# Patient Record
Sex: Female | Born: 1955 | Race: White | Hispanic: No | Marital: Married | Smoking: Never smoker
Health system: Southern US, Community
[De-identification: ages and names within clinical notes are randomized; demographics above are authoritative.]

## PROBLEM LIST (undated history)

## (undated) DIAGNOSIS — Z9889 Other specified postprocedural states: Secondary | ICD-10-CM

## (undated) DIAGNOSIS — R112 Nausea with vomiting, unspecified: Secondary | ICD-10-CM

## (undated) DIAGNOSIS — H409 Unspecified glaucoma: Secondary | ICD-10-CM

## (undated) DIAGNOSIS — T8859XA Other complications of anesthesia, initial encounter: Secondary | ICD-10-CM

## (undated) DIAGNOSIS — I341 Nonrheumatic mitral (valve) prolapse: Secondary | ICD-10-CM

## (undated) DIAGNOSIS — E7800 Pure hypercholesterolemia, unspecified: Secondary | ICD-10-CM

## (undated) DIAGNOSIS — T4145XA Adverse effect of unspecified anesthetic, initial encounter: Secondary | ICD-10-CM

## (undated) HISTORY — PX: COLPOSCOPY: SHX161

## (undated) HISTORY — PX: TONSILLECTOMY: SUR1361

## (undated) HISTORY — PX: EYE SURGERY: SHX253

## (undated) HISTORY — PX: COLONOSCOPY: SHX174

---

## 2004-01-05 ENCOUNTER — Ambulatory Visit: Payer: Self-pay | Admitting: Unknown Physician Specialty

## 2004-03-09 ENCOUNTER — Ambulatory Visit: Payer: Self-pay | Admitting: Unknown Physician Specialty

## 2005-01-03 ENCOUNTER — Ambulatory Visit: Payer: Self-pay | Admitting: Unknown Physician Specialty

## 2005-03-30 ENCOUNTER — Ambulatory Visit: Payer: Self-pay | Admitting: Unknown Physician Specialty

## 2005-04-05 ENCOUNTER — Ambulatory Visit: Payer: Self-pay | Admitting: Unknown Physician Specialty

## 2006-01-23 ENCOUNTER — Ambulatory Visit: Payer: Self-pay | Admitting: Unknown Physician Specialty

## 2006-08-21 ENCOUNTER — Ambulatory Visit: Payer: Self-pay | Admitting: Unknown Physician Specialty

## 2007-09-09 ENCOUNTER — Ambulatory Visit: Payer: Self-pay | Admitting: Unknown Physician Specialty

## 2008-10-06 ENCOUNTER — Ambulatory Visit: Payer: Self-pay | Admitting: Unknown Physician Specialty

## 2008-10-12 ENCOUNTER — Ambulatory Visit: Payer: Self-pay | Admitting: Unknown Physician Specialty

## 2009-10-11 ENCOUNTER — Ambulatory Visit: Payer: Self-pay | Admitting: Unknown Physician Specialty

## 2016-03-08 ENCOUNTER — Other Ambulatory Visit: Payer: Self-pay | Admitting: Obstetrics & Gynecology

## 2016-03-08 DIAGNOSIS — Z1231 Encounter for screening mammogram for malignant neoplasm of breast: Secondary | ICD-10-CM

## 2016-03-14 ENCOUNTER — Inpatient Hospital Stay: Admission: RE | Admit: 2016-03-14 | Payer: Self-pay | Source: Ambulatory Visit

## 2016-03-26 ENCOUNTER — Inpatient Hospital Stay: Admission: RE | Admit: 2016-03-26 | Payer: Self-pay | Source: Ambulatory Visit

## 2016-03-29 ENCOUNTER — Encounter
Admission: RE | Admit: 2016-03-29 | Discharge: 2016-03-29 | Disposition: A | Discharge status: Home / Self Care | Payer: 59 | Source: Ambulatory Visit | Attending: Obstetrics & Gynecology | Admitting: Obstetrics & Gynecology

## 2016-03-29 DIAGNOSIS — Z01810 Encounter for preprocedural cardiovascular examination: Secondary | ICD-10-CM | POA: Insufficient documentation

## 2016-03-29 DIAGNOSIS — Z01812 Encounter for preprocedural laboratory examination: Secondary | ICD-10-CM | POA: Insufficient documentation

## 2016-03-29 DIAGNOSIS — I341 Nonrheumatic mitral (valve) prolapse: Secondary | ICD-10-CM | POA: Diagnosis present

## 2016-03-29 HISTORY — DX: Nausea with vomiting, unspecified: R11.2

## 2016-03-29 HISTORY — DX: Adverse effect of unspecified anesthetic, initial encounter: T41.45XA

## 2016-03-29 HISTORY — DX: Other complications of anesthesia, initial encounter: T88.59XA

## 2016-03-29 HISTORY — DX: Nonrheumatic mitral (valve) prolapse: I34.1

## 2016-03-29 HISTORY — DX: Unspecified glaucoma: H40.9

## 2016-03-29 HISTORY — DX: Other specified postprocedural states: Z98.890

## 2016-03-29 LAB — BASIC METABOLIC PANEL
Anion gap: 6 (ref 5–15)
BUN: 16 mg/dL (ref 6–20)
CALCIUM: 9.8 mg/dL (ref 8.9–10.3)
CO2: 28 mmol/L (ref 22–32)
CREATININE: 0.67 mg/dL (ref 0.44–1.00)
Chloride: 106 mmol/L (ref 101–111)
GFR calc Af Amer: >60 mL/min (ref >60)
GLUCOSE: 95 mg/dL (ref 65–99)
Potassium: 4 mmol/L (ref 3.5–5.1)
SODIUM: 140 mmol/L (ref 135–145)

## 2016-03-29 LAB — TYPE AND SCREEN
ABO/RH(D): AB POS
Antibody Screen: NEGATIVE

## 2016-03-29 LAB — CBC
HCT: 41.4 % (ref 35.0–47.0)
Hemoglobin: 14.5 g/dL (ref 12.0–16.0)
MCH: 34.2 pg — AB (ref 26.0–34.0)
MCHC: 35.1 g/dL (ref 32.0–36.0)
MCV: 97.6 fL (ref 80.0–100.0)
PLATELETS: 218 10*3/uL (ref 150–440)
RBC: 4.25 MIL/uL (ref 3.80–5.20)
RDW: 11.9 % (ref 11.5–14.5)
WBC: 6.2 10*3/uL (ref 3.6–11.0)

## 2016-03-29 NOTE — Patient Instructions (Signed)
Your procedure is scheduled on: Monday 04/02/16 Report to DAY SURGERY. 2ND FLOOR MEDICAL MALL ENTRANCE. To find out your arrival time please call [phone] between 1PM - 3PM on Friday 03/30/16.  Remember: Instructions that are not followed completely may result in serious medical risk, up to and including death, or upon the discretion of your surgeon and anesthesiologist your surgery may need to be rescheduled.    __X__ 1. Do not eat food or drink liquids after midnight. No gum chewing or hard candies.     __X__ 2. No Alcohol for 24 hours before or after surgery.   ____ 3. Bring all medications with you on the day of surgery if instructed.    __X__ 4. Notify your doctor if there is any change in your medical condition     (cold, fever, infections).             ___X__5. No smoking within 24 hours of your surgery.     Do not wear jewelry, make-up, hairpins, clips or nail polish.  Do not wear lotions, powders, or perfumes.   Do not shave 48 hours prior to surgery. Men may shave face and neck.  Do not bring valuables to the hospital.    [facility] any belongings or valuables.               Contacts, dentures or bridgework may not be worn into surgery.  Leave your suitcase in the car. After surgery it may be brought to your room.  For patients admitted to the hospital, discharge time is determined by your                treatment team.   Patients discharged the day of surgery will not be allowed to drive home.   Please read over the following fact sheets that you were given:   Pain Booklet and MRSA Information   ____ Take these medicines the morning of surgery with A SIP OF WATER:    1. NONE  2.   3.   4.  5.  6.  ____ Fleet Enema (as directed)   __X__ Use CHG Soap as directed  ____ Use inhalers on the day of surgery  ____ Stop metformin 2 days prior to surgery    ____ Take 1/2 of usual insulin dose the night before surgery and none on the morning  of surgery.   ____ Stop Coumadin/Plavix/aspirin on   __X__ Stop Anti-inflammatories such as Advil, Aleve, Ibuprofen, Motrin, Naproxen, Naprosyn, Goodies,powder, or aspirin products.  OK to take Tylenol.   __X__ Stop supplements until after surgery.    ____ Bring C-Pap to the hospital.

## 2016-04-02 ENCOUNTER — Inpatient Hospital Stay: Payer: 59 | Admitting: Anesthesiology

## 2016-04-02 ENCOUNTER — Ambulatory Visit
Admission: RE | Admit: 2016-04-02 | Discharge: 2016-04-02 | Disposition: A | Discharge status: Home / Self Care | Payer: 59 | Source: Ambulatory Visit | Attending: Obstetrics & Gynecology | Admitting: Obstetrics & Gynecology

## 2016-04-02 ENCOUNTER — Encounter: Payer: Self-pay | Admitting: *Deleted

## 2016-04-02 ENCOUNTER — Encounter
Admission: RE | Disposition: A | Discharge status: Home / Self Care | Payer: Self-pay | Source: Ambulatory Visit | Attending: Obstetrics & Gynecology

## 2016-04-02 DIAGNOSIS — H409 Unspecified glaucoma: Secondary | ICD-10-CM | POA: Diagnosis not present

## 2016-04-02 DIAGNOSIS — Z1502 Genetic susceptibility to malignant neoplasm of ovary: Secondary | ICD-10-CM | POA: Insufficient documentation

## 2016-04-02 DIAGNOSIS — Z79899 Other long term (current) drug therapy: Secondary | ICD-10-CM | POA: Insufficient documentation

## 2016-04-02 DIAGNOSIS — Z1509 Genetic susceptibility to other malignant neoplasm: Secondary | ICD-10-CM | POA: Diagnosis not present

## 2016-04-02 DIAGNOSIS — Z8041 Family history of malignant neoplasm of ovary: Secondary | ICD-10-CM | POA: Insufficient documentation

## 2016-04-02 HISTORY — PX: LAPAROSCOPIC BILATERAL SALPINGO OOPHERECTOMY: SHX5890

## 2016-04-02 LAB — ABO/RH: ABO/RH(D): AB POS

## 2016-04-02 SURGERY — SALPINGO-OOPHORECTOMY, BILATERAL, LAPAROSCOPIC
Anesthesia: General | Laterality: Bilateral | Wound class: Clean Contaminated

## 2016-04-02 MED ORDER — KETOROLAC TROMETHAMINE 30 MG/ML IJ SOLN: 30.0000 mg | Freq: Four times a day (QID) | INTRAMUSCULAR | Status: DC

## 2016-04-02 MED ORDER — PROPOFOL 500 MG/50ML IV EMUL
INTRAVENOUS | Status: DC | PRN
  Administered 2016-04-02: 25 ug/kg/min via INTRAVENOUS

## 2016-04-02 MED ORDER — LIDOCAINE HCL (PF) 2 % IJ SOLN
INTRAMUSCULAR | Status: AC
  Filled 2016-04-02: qty 2

## 2016-04-02 MED ORDER — FENTANYL CITRATE (PF) 100 MCG/2ML IJ SOLN
INTRAMUSCULAR | Status: DC | PRN
  Administered 2016-04-02: 50 ug via INTRAVENOUS
  Administered 2016-04-02 (×2): 25 ug via INTRAVENOUS

## 2016-04-02 MED ORDER — SODIUM CHLORIDE 0.9 % IJ SOLN
INTRAMUSCULAR | Status: AC
  Filled 2016-04-02: qty 10

## 2016-04-02 MED ORDER — PROPOFOL 10 MG/ML IV BOLUS
INTRAVENOUS | Status: AC
  Filled 2016-04-02: qty 20

## 2016-04-02 MED ORDER — PROMETHAZINE HCL 25 MG/ML IJ SOLN
INTRAMUSCULAR | Status: AC
  Administered 2016-04-02: 6.25 mg via INTRAVENOUS
  Filled 2016-04-02: qty 1

## 2016-04-02 MED ORDER — MIDAZOLAM HCL 2 MG/2ML IJ SOLN
INTRAMUSCULAR | Status: AC
  Filled 2016-04-02: qty 2

## 2016-04-02 MED ORDER — ROCURONIUM BROMIDE 100 MG/10ML IV SOLN
INTRAVENOUS | Status: DC | PRN
  Administered 2016-04-02: 10 mg via INTRAVENOUS
  Administered 2016-04-02: 20 mg via INTRAVENOUS
  Administered 2016-04-02: 50 mg via INTRAVENOUS
  Administered 2016-04-02: 10 mg via INTRAVENOUS

## 2016-04-02 MED ORDER — BUPIVACAINE HCL 0.5 % IJ SOLN
INTRAMUSCULAR | Status: DC | PRN
  Administered 2016-04-02: 30 mL

## 2016-04-02 MED ORDER — ACETAMINOPHEN 650 MG RE SUPP
650.0000 mg | RECTAL | Status: DC | PRN
  Filled 2016-04-02: qty 1

## 2016-04-02 MED ORDER — DEXAMETHASONE SODIUM PHOSPHATE 10 MG/ML IJ SOLN
INTRAMUSCULAR | Status: DC | PRN
  Administered 2016-04-02: 10 mg via INTRAVENOUS

## 2016-04-02 MED ORDER — EPHEDRINE SULFATE 50 MG/ML IJ SOLN
INTRAMUSCULAR | Status: DC | PRN
  Administered 2016-04-02 (×2): 10 mg via INTRAVENOUS

## 2016-04-02 MED ORDER — FAMOTIDINE 20 MG PO TABS
20.0000 mg | ORAL_TABLET | Freq: Once | ORAL | Status: AC
  Administered 2016-04-02: 20 mg via ORAL

## 2016-04-02 MED ORDER — MORPHINE SULFATE (PF) 4 MG/ML IV SOLN: 1.0000 mg | INTRAVENOUS | Status: DC | PRN

## 2016-04-02 MED ORDER — ROCURONIUM BROMIDE 50 MG/5ML IV SOLN
INTRAVENOUS | Status: AC
  Filled 2016-04-02: qty 1

## 2016-04-02 MED ORDER — FAMOTIDINE 20 MG PO TABS
ORAL_TABLET | ORAL | Status: AC
  Administered 2016-04-02: 20 mg via ORAL
  Filled 2016-04-02: qty 1

## 2016-04-02 MED ORDER — SCOPOLAMINE 1 MG/3DAYS TD PT72
MEDICATED_PATCH | TRANSDERMAL | Status: AC
  Filled 2016-04-02: qty 1

## 2016-04-02 MED ORDER — SUGAMMADEX SODIUM 200 MG/2ML IV SOLN
INTRAVENOUS | Status: AC
  Filled 2016-04-02: qty 2

## 2016-04-02 MED ORDER — ONDANSETRON HCL 4 MG/2ML IJ SOLN
INTRAMUSCULAR | Status: DC | PRN
  Administered 2016-04-02: 4 mg via INTRAVENOUS

## 2016-04-02 MED ORDER — ACETAMINOPHEN 10 MG/ML IV SOLN
INTRAVENOUS | Status: DC | PRN
  Administered 2016-04-02: 1000 mg via INTRAVENOUS

## 2016-04-02 MED ORDER — KETOROLAC TROMETHAMINE 30 MG/ML IJ SOLN
INTRAMUSCULAR | Status: AC
  Filled 2016-04-02: qty 1

## 2016-04-02 MED ORDER — OXYCODONE HCL 5 MG PO TABS
ORAL_TABLET | ORAL | Status: AC
  Filled 2016-04-02: qty 1

## 2016-04-02 MED ORDER — ONDANSETRON HCL 4 MG/2ML IJ SOLN
INTRAMUSCULAR | Status: AC
  Filled 2016-04-02: qty 2

## 2016-04-02 MED ORDER — SUGAMMADEX SODIUM 200 MG/2ML IV SOLN
INTRAVENOUS | Status: DC | PRN
Start: 2016-04-02 — End: 2016-04-02
  Administered 2016-04-02: 150 mg via INTRAVENOUS

## 2016-04-02 MED ORDER — MEPERIDINE HCL 50 MG/ML IJ SOLN: 6.2500 mg | INTRAMUSCULAR | Status: DC | PRN

## 2016-04-02 MED ORDER — BUPIVACAINE LIPOSOME 1.3 % IJ SUSP
INTRAMUSCULAR | Status: AC
  Filled 2016-04-02: qty 20

## 2016-04-02 MED ORDER — ACETAMINOPHEN 10 MG/ML IV SOLN
INTRAVENOUS | Status: AC
  Filled 2016-04-02: qty 100

## 2016-04-02 MED ORDER — OXYCODONE HCL 5 MG/5ML PO SOLN: 5.0000 mg | Freq: Once | ORAL | Status: AC | PRN

## 2016-04-02 MED ORDER — ARTIFICIAL TEARS OP OINT
TOPICAL_OINTMENT | OPHTHALMIC | Status: AC
  Filled 2016-04-02: qty 3.5

## 2016-04-02 MED ORDER — KETOROLAC TROMETHAMINE 30 MG/ML IJ SOLN
INTRAMUSCULAR | Status: DC | PRN
  Administered 2016-04-02: 30 mg via INTRAVENOUS

## 2016-04-02 MED ORDER — FENTANYL CITRATE (PF) 100 MCG/2ML IJ SOLN
25.0000 ug | INTRAMUSCULAR | Status: DC | PRN
  Administered 2016-04-02: 25 ug via INTRAVENOUS

## 2016-04-02 MED ORDER — DEXAMETHASONE SODIUM PHOSPHATE 10 MG/ML IJ SOLN
INTRAMUSCULAR | Status: AC
  Filled 2016-04-02: qty 1

## 2016-04-02 MED ORDER — FENTANYL CITRATE (PF) 100 MCG/2ML IJ SOLN
INTRAMUSCULAR | Status: AC
  Filled 2016-04-02: qty 2

## 2016-04-02 MED ORDER — SODIUM CHLORIDE 0.9 % IV SOLN
INTRAVENOUS | Status: DC | PRN
  Administered 2016-04-02: 20 mL

## 2016-04-02 MED ORDER — LACTATED RINGERS IV SOLN
INTRAVENOUS | Status: DC | PRN
  Administered 2016-04-02 (×2): via INTRAVENOUS

## 2016-04-02 MED ORDER — LIDOCAINE HCL (CARDIAC) 20 MG/ML IV SOLN
INTRAVENOUS | Status: DC | PRN
  Administered 2016-04-02: 60 mg via INTRAVENOUS

## 2016-04-02 MED ORDER — ACETAMINOPHEN 325 MG PO TABS: 650.0000 mg | ORAL_TABLET | ORAL | Status: DC | PRN

## 2016-04-02 MED ORDER — ACETAMINOPHEN 500 MG PO TABS: 1000.0000 mg | ORAL_TABLET | Freq: Four times a day (QID) | ORAL | 0 refills | Status: AC

## 2016-04-02 MED ORDER — EPHEDRINE 5 MG/ML INJ
INTRAVENOUS | Status: AC
  Filled 2016-04-02: qty 10

## 2016-04-02 MED ORDER — LACTATED RINGERS IV SOLN
INTRAVENOUS | Status: DC
  Administered 2016-04-02: 08:00:00 via INTRAVENOUS

## 2016-04-02 MED ORDER — SCOPOLAMINE 1 MG/3DAYS TD PT72
1.0000 | MEDICATED_PATCH | Freq: Once | TRANSDERMAL | Status: DC
  Administered 2016-04-02: 1.5 mg via TRANSDERMAL

## 2016-04-02 MED ORDER — PROPOFOL 10 MG/ML IV BOLUS
INTRAVENOUS | Status: DC | PRN
  Administered 2016-04-02: 120 mg via INTRAVENOUS

## 2016-04-02 MED ORDER — BUPIVACAINE HCL (PF) 0.5 % IJ SOLN
INTRAMUSCULAR | Status: AC
  Filled 2016-04-02: qty 30

## 2016-04-02 MED ORDER — IBUPROFEN 600 MG PO TABS: 600.0000 mg | ORAL_TABLET | Freq: Four times a day (QID) | ORAL | 1 refills | Status: AC

## 2016-04-02 MED ORDER — OXYCODONE HCL 5 MG PO TABS
5.0000 mg | ORAL_TABLET | Freq: Once | ORAL | Status: AC | PRN
  Administered 2016-04-02: 5 mg via ORAL

## 2016-04-02 MED ORDER — OXYCODONE HCL 5 MG PO TABS: 5.0000 mg | ORAL_TABLET | ORAL | 0 refills | Status: AC | PRN

## 2016-04-02 MED ORDER — PROMETHAZINE HCL 25 MG/ML IJ SOLN
6.2500 mg | INTRAMUSCULAR | Status: DC | PRN
  Administered 2016-04-02: 6.25 mg via INTRAVENOUS

## 2016-04-02 SURGICAL SUPPLY — 57 items
BAG URO DRAIN 2000ML W/SPOUT (MISCELLANEOUS) ×2 IMPLANT
BLADE SURG SZ11 CARB STEEL (BLADE) ×2 IMPLANT
CANISTER SUCT 1200ML W/VALVE (MISCELLANEOUS) ×2 IMPLANT
CATH FOLEY 2WAY  5CC 16FR (CATHETERS) ×1
CATH ROBINSON RED A/P 16FR (CATHETERS) ×2 IMPLANT
CATH URTH 16FR FL 2W BLN LF (CATHETERS) ×1 IMPLANT
CHLORAPREP W/TINT 26ML (MISCELLANEOUS) ×2 IMPLANT
CNTNR SPEC 2.5X3XGRAD LEK (MISCELLANEOUS) ×1
CONT SPEC 4OZ STER OR WHT (MISCELLANEOUS) ×1
CONTAINER SPEC 2.5X3XGRAD LEK (MISCELLANEOUS) ×1 IMPLANT
DRAPE LEGGINS SURG 28X43 STRL (DRAPES) ×2 IMPLANT
DRAPE UNDER BUTTOCK W/FLU (DRAPES) ×2 IMPLANT
DRESSING TELFA 4X3 1S ST N-ADH (GAUZE/BANDAGES/DRESSINGS) ×6 IMPLANT
DRSG TEGADERM 2-3/8X2-3/4 SM (GAUZE/BANDAGES/DRESSINGS) ×4 IMPLANT
ENDOPOUCH RETRIEVER 10 (MISCELLANEOUS) ×4 IMPLANT
GAUZE SPONGE NON-WVN 2X2 STRL (MISCELLANEOUS) ×2 IMPLANT
GLOVE PI ORTHOPRO 6.5 (GLOVE) ×1
GLOVE PI ORTHOPRO STRL 6.5 (GLOVE) ×1 IMPLANT
GLOVE SURG SYN 6.5 ES PF (GLOVE) ×2 IMPLANT
GOWN STRL REUS W/ TWL LRG LVL3 (GOWN DISPOSABLE) ×2 IMPLANT
GOWN STRL REUS W/ TWL XL LVL3 (GOWN DISPOSABLE) ×1 IMPLANT
GOWN STRL REUS W/TWL LRG LVL3 (GOWN DISPOSABLE) ×2
GOWN STRL REUS W/TWL XL LVL3 (GOWN DISPOSABLE) ×1
GRASPER SUT TROCAR 14GX15 (MISCELLANEOUS) ×2 IMPLANT
IRRIGATION STRYKERFLOW (MISCELLANEOUS) IMPLANT
IRRIGATOR STRYKERFLOW (MISCELLANEOUS)
IV LACTATED RINGERS 1000ML (IV SOLUTION) ×2 IMPLANT
KIT PINK PAD W/HEAD ARE REST (MISCELLANEOUS) ×2
KIT PINK PAD W/HEAD ARM REST (MISCELLANEOUS) ×1 IMPLANT
KIT RM TURNOVER CYSTO AR (KITS) ×2 IMPLANT
L-HOOK LAP DISP 36CM (ELECTROSURGICAL) ×2
LABEL OR SOLS (LABEL) IMPLANT
LHOOK LAP DISP 36CM (ELECTROSURGICAL) ×1 IMPLANT
LIGASURE MARYLAND LAP STAND (ELECTROSURGICAL) ×2 IMPLANT
LIGASURE VESSEL 5MM BLUNT TIP (ELECTROSURGICAL) ×2 IMPLANT
LIQUID BAND (GAUZE/BANDAGES/DRESSINGS) IMPLANT
MANIPULATOR UTERINE 4.5 ZUMI (MISCELLANEOUS) ×2 IMPLANT
MANIPULATOR UTERINE ZUMI 4.5 (MISCELLANEOUS) ×2 IMPLANT
NEEDLE VERESS 14GA 120MM (NEEDLE) ×2 IMPLANT
NS IRRIG 500ML POUR BTL (IV SOLUTION) ×2 IMPLANT
PACK LAP CHOLECYSTECTOMY (MISCELLANEOUS) ×2 IMPLANT
PAD OB MATERNITY 4.3X12.25 (PERSONAL CARE ITEMS) ×2 IMPLANT
PAD PREP 24X41 OB/GYN DISP (PERSONAL CARE ITEMS) ×2 IMPLANT
PENCIL ELECTRO HAND CTR (MISCELLANEOUS) ×2 IMPLANT
SCISSORS METZENBAUM CVD 33 (INSTRUMENTS) ×2 IMPLANT
SET CYSTO W/LG BORE CLAMP LF (SET/KITS/TRAYS/PACK) ×2 IMPLANT
SHEARS HARMONIC ACE PLUS 36CM (ENDOMECHANICALS) ×2 IMPLANT
SLEEVE ENDOPATH XCEL 5M (ENDOMECHANICALS) ×4 IMPLANT
SPONGE VERSALON 2X2 STRL (MISCELLANEOUS) ×2
SUT MNCRL AB 4-0 PS2 18 (SUTURE) ×2 IMPLANT
SUT VIC AB 2-0 UR6 27 (SUTURE) ×2 IMPLANT
SUT VIC AB 4-0 PS2 18 (SUTURE) ×2 IMPLANT
SYR 30ML LL (SYRINGE) ×2 IMPLANT
SYRINGE 10CC LL (SYRINGE) ×2 IMPLANT
TROCAR ENDO BLADELESS 11MM (ENDOMECHANICALS) ×2 IMPLANT
TROCAR XCEL NON-BLD 5MMX100MML (ENDOMECHANICALS) ×2 IMPLANT
TUBING INSUFFLATOR HI FLOW (MISCELLANEOUS) ×2 IMPLANT

## 2016-04-02 NOTE — Op Note (Signed)
04/02/2016  PATIENT:  Heather Madden  61 y.o. 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 Normal upper abodmen, with small adhesion of omentum to right upper abdominal wall.  2. Adherent and redundant sigmoid colon in pelvis.  Left adhesions > Right.  Fimbriated end of Left tube, posterior left ovary adherent to pelvic side wall and sigmoid colon.  3. Ureters unable to be visualized trans-or retro-peritoneally. 4.  Bilateral ureteral jets visualized via cystoscope.  5. Normal appearing tubes and ovaries bilaterally.  Normal small uterus.  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    Procedure: The patient was brought to the OR and identified as Heather Madden.  She was given general anesthesia via endotracheal route, positioned in the dorsal lithotomy position and prepped and draped in the usual sterile fashion.  A surgical time-out was called. A foley catheter was placed.  A speculum was placed in the vagina and the cervix was visualized, grasped with a single tooth tenaculum and dilated to accommodate an endometrial pipelle.  Two passes were performed, and specimen handed off.  The Zumi uterine  manipulator was placed in the uterus.  After a change of gloves, the attention was turned to the abdomen.  A periumbilical incision was made, the subcutaneous tissues were dissected, the fascia was divided, the peritoneum entered, and the GelPoint single site apparatus was inserted.  Pneumoperitoneum was created to 33mHg.   Brief survey of the abdomen was performed and appeared normal.  There was a small filmy adhesion of the omentum to the upper right abdominal wall.  This was left alone.    The right fallopian tube and ovary was grasped and the Ligasure was used to seal and divide the IP ligament, and the tube and ovary were separated from the underlying tissue to the cornua.  The specimen was placed in the anterior culdesac.  The adhesions of the left were unable to be safely manipulated through the GelPoint so a 519mtrochar was placed in the RLQ.  The left tube and ovary were tented upwards, but due to bowel adhesions the ureter was unable to be visualized.  The underlying tissues and IP ligaments were skeletonized and dissected/transected and hemostatic.   The tubes and ovaries were placed in an endocatch bag and retracted from the abdomen.  The pneumoperitoneum was deflated then recreated and all operative sites were hemostatic.    The pneumoperitoneum was deflated and Gelpoint and 19m41mrochar were removed.  The fascia was closed with a running stitch of 0-Vicryl and tested for integrity.  The subcutaneous tissues and skin were reapproximated with 4-0 monocryl.  The skin was then closed with sugical glue.    The attention was turned to the bladder.  A 70-degree cystoscope was inserted into  the urethra after the foley was removed.  The bladder was inflated with 300cc of normal saline, and inspection of the bladder wall was performed.  Bilateral ureteral jets were observed.  The cystoscope was removed.  The patient tolerated the procedure, the sponge, needle, and instrument counts were correct x2, and  the patient was brought to PACU extubated, and in a stable condition.  I was present for, and performed this procedure in its entirety.  ----- Larey Days, MD Attending Obstetrician and Gynecologist [facility], Department of [facility]

## 2016-04-02 NOTE — Discharge Instructions (Signed)
Discharge instructions:  Call office if you have any of the following: fever >101 F, chills, excessive vaginal bleeding, incision drainage or problems, leg pain or redness, or any other concerns.   Activity: Do not lift > 10 lbs for 6 weeks.  No driving until you are sure you can slam on the brakes.   You may feel some pain in your upper right abdomen/rib and right shoulder.  This is from the gas in the abdomen for surgery. This will subside over time, please be patient!  Take 600mg  Ibuprofen and 1000mg  Tylenol around the clock, every 6 hours for at least the first 3-5 days.  After this you can take as needed.  This will help decrease inflammation and promote healing.  The narcotics you'll take just as needed, as they just trick your brain into thinking its not in pain.    Please don't limit yourself in terms of routine activity.  You will be able to do most things, although they may take longer to do or be a little painful.  You can do it!   AMBULATORY SURGERY  DISCHARGE INSTRUCTIONS   1) The drugs that you were given will stay in your system until tomorrow so for the next 24 hours you should not:  A) Drive an automobile B) Make any legal decisions C) Drink any alcoholic beverage   2) You may resume regular meals tomorrow.  Today it is better to start with liquids and gradually work up to solid foods.  You may eat anything you prefer, but it is better to start with liquids, then soup and crackers, and gradually work up to solid foods.   3) Please notify your doctor immediately if you have any unusual bleeding, trouble breathing, redness and pain at the surgery site, drainage, fever, or pain not relieved by medication.    4) Additional Instructions:        Please contact your physician with any problems or Same Day Surgery at [phone], Monday through Friday 6 am to 4 pm, or [place] at [facility] Main number at [phone].  Don't be a hero, but don't be a wimp  either!

## 2016-04-02 NOTE — Anesthesia Procedure Notes (Signed)
Procedure Name: Intubation Date/Time: 04/02/2016 9:42 AM Performed by: Darlyne Russian Pre-anesthesia Checklist: Patient identified, Emergency Drugs available, Suction available, Patient being monitored and Timeout performed Patient Re-evaluated:Patient Re-evaluated prior to inductionOxygen Delivery Method: Circle system utilized Preoxygenation: Pre-oxygenation with 100% oxygen Intubation Type: IV induction Ventilation: Mask ventilation without difficulty Laryngoscope Size: Mac and 3 Grade View: Grade I Tube type: Oral Number of attempts: 1 Airway Equipment and Method: Stylet Placement Confirmation: ETT inserted through vocal cords under direct vision,  positive ETCO2 and breath sounds checked- equal and bilateral Secured at: 21 cm Tube secured with: Tape Dental Injury: Teeth and Oropharynx as per pre-operative assessment  Comments: Pt has existing scabs on upper lip.

## 2016-04-02 NOTE — Anesthesia Postprocedure Evaluation (Signed)
Anesthesia Post Note  Patient: Tanesha A Wickizer  Procedure(s) Performed: Procedure(s) (LRB): LAPAROSCOPIC BILATERAL SALPINGO OOPHORECTOMY (Bilateral)  Patient location during evaluation: PACU Anesthesia Type: General Level of consciousness: awake and alert and oriented Pain management: pain level controlled Vital Signs Assessment: post-procedure vital signs reviewed and stable Respiratory status: spontaneous breathing, nonlabored ventilation and respiratory function stable Cardiovascular status: blood pressure returned to baseline and stable Postop Assessment: no signs of nausea or vomiting Anesthetic complications: no     Last Vitals:  Vitals:   04/02/16 1159 04/02/16 1214  BP: 124/73 112/69  Pulse:  79  Resp: (!) 23 18  Temp: 36.2 C     Last Pain:  Vitals:   04/02/16 1229  TempSrc:   PainSc: 3                  Trenton Passow

## 2016-04-02 NOTE — H&P (Signed)
Preoperative History and Physical  Heather Madden is a 61 y.o.  here due to a genetic mutation found that carries a high risk of ovarian and fallopian tube cancer.  RAD51c -- recommendations from NCCN are to have a risk-reducing salpingo-oophorectomy by age 51.   No significant preoperative concerns.  Proposed surgery: Laparoscopic bilateral salpingo-oophorectomy  Past Medical History:  Diagnosis Date  . Complication of anesthesia    SEE ALLERGIES  . Glaucoma   . MVP (mitral valve prolapse)   . PONV (postoperative nausea and vomiting)    Past Surgical History:  Procedure Laterality Date  . COLONOSCOPY    . COLPOSCOPY    . EYE SURGERY     DETACHED RETINA  . TONSILLECTOMY     OB History  No data available  Patient denies any other pertinent gynecologic issues.   No current facility-administered medications on file prior to encounter.    No current outpatient prescriptions on file prior to encounter.   Allergies  Allergen Reactions  . Combigan [Brimonidine Tartrate-Timolol] Swelling  . Fentanyl Nausea And Vomiting    VOMITING  . Midazolam Nausea And Vomiting    VOMITING  . Timolol Swelling    Social History:   reports that she has never smoked. She has never used smokeless tobacco. She reports that she drinks about 1.2 oz of alcohol per week . She reports that she does not use drugs.  History reviewed. No pertinent family history.  Review of Systems: Noncontributory  PHYSICAL EXAM: Blood pressure 128/78, pulse 84, temperature 97.5 F (36.4 C), temperature source Tympanic, resp. rate 18, height 5' 4.5" (1.638 m), weight 74.4 kg (164 lb), SpO2 96 %. General appearance - alert, well appearing, and in no distress Chest - clear to auscultation, no wheezes, rales or rhonchi, symmetric air entry Heart - normal rate and regular rhythm Abdomen - soft, nontender, nondistended, no masses or organomegaly Pelvic - examination not indicated Extremities - peripheral pulses  normal, no pedal edema, no clubbing or cyanosis  Labs: Results for orders placed or performed during the hospital encounter of 04/02/16 (from the past 336 hour(s))  ABO/Rh   Collection Time: 04/02/16  8:11 AM  Result Value Ref Range   ABO/RH(D) AB POS   Results for orders placed or performed during the hospital encounter of 03/29/16 (from the past 336 hour(s))  CBC   Collection Time: 03/29/16 12:14 PM  Result Value Ref Range   WBC 6.2 3.6 - 11.0 K/uL   RBC 4.25 3.80 - 5.20 MIL/uL   Hemoglobin 14.5 12.0 - 16.0 g/dL   HCT 41.4 35.0 - 47.0 %   MCV 97.6 80.0 - 100.0 fL   MCH 34.2 (H) 26.0 - 34.0 pg   MCHC 35.1 32.0 - 36.0 g/dL   RDW 11.9 11.5 - 14.5 %   Platelets 218 150 - 440 K/uL  Basic metabolic panel   Collection Time: 03/29/16 12:14 PM  Result Value Ref Range   Sodium 140 135 - 145 mmol/L   Potassium 4.0 3.5 - 5.1 mmol/L   Chloride 106 101 - 111 mmol/L   CO2 28 22 - 32 mmol/L   Glucose, Bld 95 65 - 99 mg/dL   BUN 16 6 - 20 mg/dL   Creatinine, Ser 0.67 0.44 - 1.00 mg/dL   Calcium 9.8 8.9 - 10.3 mg/dL   GFR calc non Af Amer >60 >60 mL/min   GFR calc Af Amer >60 >60 mL/min   Anion gap 6 5 - 15  Type  and screen [place]   Collection Time: 03/29/16 12:14 PM  Result Value Ref Range   ABO/RH(D) AB POS    Antibody Screen NEG    Sample Expiration 04/12/2016    Extend sample reason NO TRANSFUSIONS OR PREGNANCY IN THE PAST 3 MONTHS     Imaging Studies: No results found.  Assessment: There are no active problems to display for this patient.   Plan: Patient will undergo surgical management with laparoscopic bilateral salpingo-oophorectomy.   The risks of surgery were discussed in detail with the patient including but not limited to: bleeding which may require transfusion or reoperation; infection which may require antibiotics; injury to surrounding organs which may involve bowel, bladder, ureters ; need for additional procedures including laparotomy;  thromboembolic phenomenon, surgical site problems and other postoperative/anesthesia complications. Likelihood of success in alleviating the patient's condition was discussed. Routine postoperative instructions will be reviewed with the patient and her family in detail after surgery.  The patient concurred with the proposed plan, giving informed written consent for the surgery.  Patient has been NPO since last night she will remain NPO for procedure.  Anesthesia and OR aware.  Preoperative prophylactic antibiotics and SCDs ordered on call to the OR.  To OR when ready.  ----- Larey Days, MD Attending Obstetrician and Gynecologist [facility], Department of [facility]

## 2016-04-02 NOTE — Anesthesia Preprocedure Evaluation (Signed)
Anesthesia Evaluation  Patient identified by MRN, date of birth, ID band Patient awake    Reviewed: Allergy & Precautions, NPO status , Patient's Chart, lab work & pertinent test results  History of Anesthesia Complications (+) PONV and history of anesthetic complications  Airway Mallampati: III  TM Distance: >3 FB Neck ROM: Full    Dental no notable dental hx.    Pulmonary neg pulmonary ROS, neg sleep apnea, neg COPD,    breath sounds clear to auscultation- rhonchi (-) wheezing      Cardiovascular Exercise Tolerance: Good (-) hypertension(-) CAD and (-) Past MI + Valvular Problems/Murmurs MVP  Rhythm:Regular Rate:Normal - Systolic murmurs and - Diastolic murmurs    Neuro/Psych negative neurological ROS  negative psych ROS   GI/Hepatic negative GI ROS, Neg liver ROS,   Endo/Other  neg diabetes  Renal/GU negative Renal ROS     Musculoskeletal negative musculoskeletal ROS (+)   Abdominal (+) - obese,   Peds  Hematology negative hematology ROS (+)   Anesthesia Other Findings Past Medical History: No date: Complication of anesthesia     Comment: SEE ALLERGIES No date: Glaucoma No date: MVP (mitral valve prolapse) No date: PONV (postoperative nausea and vomiting)   Reproductive/Obstetrics                             Anesthesia Physical Anesthesia Plan  ASA: II  Anesthesia Plan: General   Post-op Pain Management:    Induction: Intravenous  Airway Management Planned: Oral ETT  Additional Equipment:   Intra-op Plan:   Post-operative Plan: Extubation in OR  Informed Consent: I have reviewed the patients History and Physical, chart, labs and discussed the procedure including the risks, benefits and alternatives for the proposed anesthesia with the patient or authorized representative who has indicated his/her understanding and acceptance.   Dental advisory given  Plan Discussed  with: CRNA and Anesthesiologist  Anesthesia Plan Comments:         Anesthesia Quick Evaluation

## 2016-04-02 NOTE — Transfer of Care (Signed)
Immediate Anesthesia Transfer of Care Note  Patient: Heather Madden  Procedure(s) Performed: Procedure(s): LAPAROSCOPIC BILATERAL SALPINGO OOPHORECTOMY (Bilateral)  Patient Location: PACU  Anesthesia Type:General  Level of Consciousness: patient cooperative and lethargic  Airway & Oxygen Therapy: Patient Spontanous Breathing and Patient connected to face mask oxygen  Post-op Assessment: Report given to RN and Post -op Vital signs reviewed and stable  Post vital signs: Reviewed and stable  Last Vitals:  Vitals:   04/02/16 0755 04/02/16 1159  BP: 128/78 124/73  Pulse: 84   Resp: 18 (!) 23  Temp: 36.4 C 36.2 C    Last Pain:  Vitals:   04/02/16 0755  TempSrc: Tympanic         Complications: No apparent anesthesia complications

## 2016-04-02 NOTE — Anesthesia Post-op Follow-up Note (Cosign Needed)
Anesthesia QCDR form completed.        

## 2016-04-03 LAB — CYTOLOGY - NON PAP

## 2016-04-03 LAB — SURGICAL PATHOLOGY

## 2016-04-04 ENCOUNTER — Other Ambulatory Visit: Payer: Self-pay | Admitting: Obstetrics & Gynecology

## 2016-04-04 ENCOUNTER — Ambulatory Visit
Admission: RE | Admit: 2016-04-04 | Discharge: 2016-04-04 | Disposition: A | Discharge status: Home / Self Care | Payer: 59 | Source: Ambulatory Visit | Attending: Obstetrics & Gynecology | Admitting: Obstetrics & Gynecology

## 2016-04-04 DIAGNOSIS — Z1231 Encounter for screening mammogram for malignant neoplasm of breast: Secondary | ICD-10-CM

## 2016-04-10 ENCOUNTER — Inpatient Hospital Stay
Admission: RE | Admit: 2016-04-10 | Discharge: 2016-04-10 | Disposition: A | Discharge status: Home / Self Care | Payer: Self-pay | Source: Ambulatory Visit | Attending: *Deleted | Admitting: *Deleted

## 2016-04-10 ENCOUNTER — Other Ambulatory Visit: Payer: Self-pay | Admitting: *Deleted

## 2016-04-10 DIAGNOSIS — Z9289 Personal history of other medical treatment: Secondary | ICD-10-CM

## 2017-06-06 ENCOUNTER — Other Ambulatory Visit: Payer: Self-pay | Admitting: Obstetrics & Gynecology

## 2017-06-06 DIAGNOSIS — Z1231 Encounter for screening mammogram for malignant neoplasm of breast: Secondary | ICD-10-CM

## 2017-06-27 ENCOUNTER — Ambulatory Visit
Admission: RE | Admit: 2017-06-27 | Discharge: 2017-06-27 | Disposition: A | Discharge status: Home / Self Care | Payer: 59 | Source: Ambulatory Visit | Attending: Obstetrics & Gynecology | Admitting: Obstetrics & Gynecology

## 2017-06-27 DIAGNOSIS — Z1231 Encounter for screening mammogram for malignant neoplasm of breast: Secondary | ICD-10-CM | POA: Insufficient documentation

## 2018-07-28 ENCOUNTER — Other Ambulatory Visit: Payer: Self-pay | Admitting: Internal Medicine

## 2018-07-28 DIAGNOSIS — Z1231 Encounter for screening mammogram for malignant neoplasm of breast: Secondary | ICD-10-CM

## 2018-08-04 ENCOUNTER — Other Ambulatory Visit: Payer: Self-pay

## 2018-08-04 ENCOUNTER — Ambulatory Visit
Admission: RE | Admit: 2018-08-04 | Discharge: 2018-08-04 | Disposition: A | Discharge status: Home / Self Care | Payer: BC Managed Care – PPO | Source: Ambulatory Visit | Attending: Internal Medicine | Admitting: Internal Medicine

## 2018-08-04 DIAGNOSIS — Z1231 Encounter for screening mammogram for malignant neoplasm of breast: Secondary | ICD-10-CM | POA: Diagnosis not present

## 2019-08-26 ENCOUNTER — Other Ambulatory Visit: Payer: Self-pay | Admitting: Internal Medicine

## 2019-08-26 DIAGNOSIS — Z1231 Encounter for screening mammogram for malignant neoplasm of breast: Secondary | ICD-10-CM

## 2019-09-14 ENCOUNTER — Other Ambulatory Visit: Payer: Self-pay

## 2019-09-14 ENCOUNTER — Ambulatory Visit
Admission: RE | Admit: 2019-09-14 | Discharge: 2019-09-14 | Disposition: A | Discharge status: Home / Self Care | Payer: BC Managed Care – PPO | Source: Ambulatory Visit | Attending: Internal Medicine | Admitting: Internal Medicine

## 2019-09-14 DIAGNOSIS — Z1231 Encounter for screening mammogram for malignant neoplasm of breast: Secondary | ICD-10-CM | POA: Diagnosis present

## 2019-11-25 ENCOUNTER — Other Ambulatory Visit: Payer: Self-pay | Admitting: Internal Medicine

## 2019-11-25 ENCOUNTER — Other Ambulatory Visit (HOSPITAL_COMMUNITY): Payer: Self-pay | Admitting: Internal Medicine

## 2019-11-25 DIAGNOSIS — R748 Abnormal levels of other serum enzymes: Secondary | ICD-10-CM

## 2019-12-03 ENCOUNTER — Ambulatory Visit
Admission: RE | Admit: 2019-12-03 | Discharge: 2019-12-03 | Disposition: A | Discharge status: Home / Self Care | Payer: BC Managed Care – PPO | Source: Ambulatory Visit | Attending: Internal Medicine | Admitting: Internal Medicine

## 2019-12-03 ENCOUNTER — Other Ambulatory Visit: Payer: Self-pay

## 2019-12-03 DIAGNOSIS — R748 Abnormal levels of other serum enzymes: Secondary | ICD-10-CM | POA: Insufficient documentation

## 2020-05-19 ENCOUNTER — Emergency Department
Admission: EM | Admit: 2020-05-19 | Discharge: 2020-05-19 | Disposition: A | Discharge status: Home / Self Care | Payer: BC Managed Care – PPO | Attending: Emergency Medicine | Admitting: Emergency Medicine

## 2020-05-19 ENCOUNTER — Encounter: Payer: Self-pay | Admitting: Emergency Medicine

## 2020-05-19 ENCOUNTER — Other Ambulatory Visit: Payer: Self-pay

## 2020-05-19 DIAGNOSIS — Z203 Contact with and (suspected) exposure to rabies: Secondary | ICD-10-CM | POA: Diagnosis present

## 2020-05-19 DIAGNOSIS — Z2914 Encounter for prophylactic rabies immune globin: Secondary | ICD-10-CM | POA: Insufficient documentation

## 2020-05-19 DIAGNOSIS — Z23 Encounter for immunization: Secondary | ICD-10-CM | POA: Insufficient documentation

## 2020-05-19 DIAGNOSIS — Z209 Contact with and (suspected) exposure to unspecified communicable disease: Secondary | ICD-10-CM

## 2020-05-19 MED ORDER — RABIES VACCINE, PCEC IM SUSR
1.0000 mL | Freq: Once | INTRAMUSCULAR | Status: AC
  Administered 2020-05-19: 1 mL via INTRAMUSCULAR
  Filled 2020-05-19: qty 1

## 2020-05-19 MED ORDER — RABIES IMMUNE GLOBULIN 150 UNIT/ML IM INJ
20.0000 [IU]/kg | INJECTION | Freq: Once | INTRAMUSCULAR | Status: AC
  Administered 2020-05-19: 1500 [IU] via INTRAMUSCULAR
  Filled 2020-05-19: qty 10

## 2020-05-19 NOTE — ED Provider Notes (Signed)
[facility] Emergency Department Provider Note  ____________________________________________   None    (approximate)  I have reviewed the triage vital signs and the nursing notes.   HISTORY  Chief Complaint Rabies Injection   HPI Heather Madden is a 65 y.o. female presents to the ED with possible exposure to bat on Tuesday.  Patient states that she was walking her dog and felt something sweet down and hit her head.  She states that there were bats in the area flying over her.  She reports that she called the rabies hotline in [place] and was told that she would need to come to the ED for rabies vaccine.         Past Medical History:  Diagnosis Date  . Complication of anesthesia    SEE ALLERGIES  . Glaucoma   . MVP (mitral valve prolapse)   . PONV (postoperative nausea and vomiting)     There are no problems to display for this patient.   Past Surgical History:  Procedure Laterality Date  . COLONOSCOPY    . COLPOSCOPY    . EYE SURGERY     DETACHED RETINA  . LAPAROSCOPIC BILATERAL SALPINGO OOPHERECTOMY Bilateral 04/02/2016   Procedure: LAPAROSCOPIC BILATERAL SALPINGO OOPHORECTOMY;  Surgeon: Elenora Fender Ward, MD;  Location: [facility] ORS;  Service: Gynecology;  Laterality: Bilateral;  . TONSILLECTOMY      Prior to Admission medications   Medication Sig Start Date End Date Taking? Authorizing Provider  acetaminophen (TYLENOL) 500 MG tablet Take 2 tablets (1,000 mg total) by mouth every 6 (six) hours. 04/02/16   Ward, Elenora Fender, MD  calcium-vitamin D (OSCAL WITH D) 500-200 MG-UNIT tablet Take 1 tablet by mouth daily.    [provider]  Cholecalciferol (VITAMIN D3) 5000 units TABS Take 1 tablet by mouth daily.    [provider]  Coenzyme Q10 (CO Q 10 PO) Take 100 mg by mouth daily.    [provider]  DOCOSAHEXAENOIC ACID-EPA PO Take 30 mLs by mouth daily.    [provider]  dorzolamide (TRUSOPT) 2 % ophthalmic  solution Place 1 drop into both eyes 3 (three) times daily.    [provider]  ibuprofen (ADVIL,MOTRIN) 600 MG tablet Take 1 tablet (600 mg total) by mouth every 6 (six) hours. 04/02/16   Ward, Elenora Fender, MD  latanoprost (XALATAN) 0.005 % ophthalmic solution Place 1 drop into both eyes at bedtime.    [provider]  oxyCODONE (OXY IR/ROXICODONE) 5 MG immediate release tablet Take 1 tablet (5 mg total) by mouth every 4 (four) hours as needed for severe pain. 04/02/16   Ward, Elenora Fender, MD  Psyllium Husk POWD by Does not apply route.    [provider]  Red Yeast Rice Extract (RED YEAST RICE PO) Take 2,400 mg by mouth daily.    [provider]  UNABLE TO FIND Med Name: [facility] RED POWDER    [provider]    Allergies Combigan [brimonidine tartrate-timolol], Fentanyl, Midazolam, and Timolol  Family History  Problem Relation Age of Onset  . Breast cancer Maternal Aunt 50  . Ovarian cancer Maternal Aunt 70  . Breast cancer Cousin        maternal  . Breast cancer Maternal Aunt     Social History Social History   Tobacco Use  . Smoking status: Never Smoker  . Smokeless tobacco: Never Used  Substance Use Topics  . Alcohol use: Yes    Alcohol/week: 2.0  standard drinks    Types: 2 Glasses of wine per week  . Drug use: No    Review of Systems Constitutional: No fever/chills Eyes: No visual changes. ENT: No sore throat. Cardiovascular: Denies chest pain. Respiratory: Denies shortness of breath. Gastrointestinal: No abdominal pain.  No nausea, no vomiting. Genitourinary: Negative for dysuria. Musculoskeletal: Negative for musculoskeletal pain. Skin: Negative for rash. Neurological: Negative for headaches, focal weakness or numbness.  ____________________________________________   PHYSICAL EXAM:  VITAL SIGNS: ED Triage Vitals  Enc Vitals Group     BP      Pulse      Resp      Temp      Temp src      SpO2      Weight       Height      Head Circumference      Peak Flow      Pain Score      Pain Loc      Pain Edu?      Excl. in GC?     Constitutional: Alert and oriented. Well appearing and in no acute distress. Eyes: Conjunctivae are normal. PERRL. EOMI. Head: Atraumatic. Nose: No congestion/rhinnorhea. Neck: No stridor.   Cardiovascular: Normal rate, regular rhythm. Grossly normal heart sounds.  Good peripheral circulation. Respiratory: Normal respiratory effort.  No retractions. Lungs CTAB. Musculoskeletal: Moves upper and lower extremities they have difficulty.  Normal gait was noted. Neurologic:  Normal speech and language. No gross focal neurologic deficits are appreciated.  Skin:  Skin is warm, dry and intact. No rash noted.   Psychiatric: Mood and affect are normal. Speech and behavior are normal.  ____________________________________________   LABS (all labs ordered are listed, but only abnormal results are displayed)  Labs Reviewed - No data to display ____________________________________________  PROCEDURES  Procedure(s) performed (including Critical Care):  Procedures   ____________________________________________   INITIAL IMPRESSION / ASSESSMENT AND PLAN / ED COURSE  As part of my medical decision making, I reviewed the following data within the electronic MEDICAL RECORD NUMBER Notes from prior ED visits and [place] Controlled Substance Database  65 year old female presents to the ED with possible exposure to a bat on Tuesday.  Patient states she was out walking her dog and felt something hit her head.  She talked with rabies hotline in [place] who told her that she would need to come to the ED to get rabies vaccine.  Patient had no difficulties with the first injections.  She was given dates for return for the remaining RabAvert injections.  ____________________________________________   FINAL CLINICAL IMPRESSION(S) / ED DIAGNOSES  Final diagnoses:  Exposure to bat without known  bite     ED Discharge Orders    None      *Please note:  Heather Madden was evaluated in Emergency Department on 05/19/2020 for the symptoms described in the history of present illness. She was evaluated in the context of the global COVID-19 pandemic, which necessitated consideration that the patient might be at risk for infection with the SARS-CoV-2 virus that causes COVID-19. Institutional protocols and algorithms that pertain to the evaluation of patients at risk for COVID-19 are in a state of rapid change based on information released by regulatory bodies including the CDC and federal and state organizations. These policies and algorithms were followed during the patient's care in the ED.  Some ED evaluations and interventions may be delayed as a result of limited staffing during and the pandemic.*  Note:  This document was prepared using Dragon voice recognition software and may include unintentional dictation errors.    Tommi Rumps, PA-C 05/19/20 1527    Sharman Cheek, MD 05/21/20 [phone]

## 2020-05-19 NOTE — Discharge Instructions (Addendum)
Return to the emergency department for future rabies injections.  The next injections will only be 1 each time you come instead of the multiple injection she got today.  Return dates 5/1 5/5 5/14

## 2020-05-19 NOTE — ED Triage Notes (Signed)
Presents for possible bat exposure on Tuesday  States she was walking her dog and felt something hit her head  Did not see the bat but there were bats area the area

## 2020-05-22 ENCOUNTER — Encounter: Payer: Self-pay | Admitting: Emergency Medicine

## 2020-05-22 ENCOUNTER — Emergency Department
Admission: EM | Admit: 2020-05-22 | Discharge: 2020-05-22 | Disposition: A | Discharge status: Home / Self Care | Payer: BC Managed Care – PPO | Attending: Physician Assistant | Admitting: Physician Assistant

## 2020-05-22 ENCOUNTER — Other Ambulatory Visit: Payer: Self-pay

## 2020-05-22 DIAGNOSIS — Z23 Encounter for immunization: Secondary | ICD-10-CM | POA: Insufficient documentation

## 2020-05-22 DIAGNOSIS — Z203 Contact with and (suspected) exposure to rabies: Secondary | ICD-10-CM | POA: Insufficient documentation

## 2020-05-22 MED ORDER — RABIES VACCINE, PCEC IM SUSR
1.0000 mL | Freq: Once | INTRAMUSCULAR | Status: AC
  Administered 2020-05-22: 1 mL via INTRAMUSCULAR
  Filled 2020-05-22: qty 1

## 2020-05-22 NOTE — Discharge Instructions (Addendum)
Return on May 5 tfor third shot.

## 2020-05-22 NOTE — ED Notes (Signed)
Patient here for 3rd day rabies injection

## 2020-05-22 NOTE — ED Provider Notes (Signed)
[facility] Emergency Department Provider Note   ____________________________________________   Event Date/Time   First MD Initiated Contact with Patient 05/22/20 [phone]     (approximate)  I have reviewed the triage vital signs and the nursing notes.   HISTORY  Chief Complaint Rabies Injection    HPI Heather Madden is a 65 y.o. female patient presents for second in a series of rabies shots due to possible exposure from a bat.  No visible bites or scratches.  No complications from first series.         Past Medical History:  Diagnosis Date  . Complication of anesthesia    SEE ALLERGIES  . Glaucoma   . MVP (mitral valve prolapse)   . PONV (postoperative nausea and vomiting)     There are no problems to display for this patient.   Past Surgical History:  Procedure Laterality Date  . COLONOSCOPY    . COLPOSCOPY    . EYE SURGERY     DETACHED RETINA  . LAPAROSCOPIC BILATERAL SALPINGO OOPHERECTOMY Bilateral 04/02/2016   Procedure: LAPAROSCOPIC BILATERAL SALPINGO OOPHORECTOMY;  Surgeon: Elenora Fender Ward, MD;  Location: [facility] ORS;  Service: Gynecology;  Laterality: Bilateral;  . TONSILLECTOMY      Prior to Admission medications   Medication Sig Start Date End Date Taking? Authorizing Provider  acetaminophen (TYLENOL) 500 MG tablet Take 2 tablets (1,000 mg total) by mouth every 6 (six) hours. 04/02/16   Ward, Elenora Fender, MD  calcium-vitamin D (OSCAL WITH D) 500-200 MG-UNIT tablet Take 1 tablet by mouth daily.    [provider]  Cholecalciferol (VITAMIN D3) 5000 units TABS Take 1 tablet by mouth daily.    [provider]  Coenzyme Q10 (CO Q 10 PO) Take 100 mg by mouth daily.    [provider]  DOCOSAHEXAENOIC ACID-EPA PO Take 30 mLs by mouth daily.    [provider]  dorzolamide (TRUSOPT) 2 % ophthalmic solution Place 1 drop into both eyes 3 (three) times daily.    [provider]  ibuprofen  (ADVIL,MOTRIN) 600 MG tablet Take 1 tablet (600 mg total) by mouth every 6 (six) hours. 04/02/16   Ward, Elenora Fender, MD  latanoprost (XALATAN) 0.005 % ophthalmic solution Place 1 drop into both eyes at bedtime.    [provider]  oxyCODONE (OXY IR/ROXICODONE) 5 MG immediate release tablet Take 1 tablet (5 mg total) by mouth every 4 (four) hours as needed for severe pain. 04/02/16   Ward, Elenora Fender, MD  Psyllium Husk POWD by Does not apply route.    [provider]  Red Yeast Rice Extract (RED YEAST RICE PO) Take 2,400 mg by mouth daily.    [provider]  UNABLE TO FIND Med Name: [facility] RED POWDER    [provider]    Allergies Combigan [brimonidine tartrate-timolol], Fentanyl, Midazolam, and Timolol  Family History  Problem Relation Age of Onset  . Breast cancer Maternal Aunt 50  . Ovarian cancer Maternal Aunt 70  . Breast cancer Cousin        maternal  . Breast cancer Maternal Aunt     Social History Social History   Tobacco Use  . Smoking status: Never Smoker  . Smokeless tobacco: Never Used  Substance Use Topics  . Alcohol use: Yes    Alcohol/week: 2.0 standard drinks    Types: 2 Glasses of wine per week  . Drug use: No    Review of Systems Constitutional: No  fever/chills Eyes: No visual changes. ENT: No sore throat. Cardiovascular: Denies chest pain. Respiratory: Denies shortness of breath. Gastrointestinal: No abdominal pain.  No nausea, no vomiting.  No diarrhea.  No constipation. Genitourinary: Negative for dysuria. Musculoskeletal: Negative for back pain. Skin: Negative for rash. Neurological: Negative for headaches, focal weakness or numbness. Allergic/Immunilogical: Combigan,Fentanyl, Midazolam, and, Timolol. ____________________________________________   PHYSICAL EXAM:  VITAL SIGNS: ED Triage Vitals [05/22/20 0835]  Enc Vitals Group     BP      Pulse      Resp      Temp      Temp src      SpO2      Weight 162  lb 0.6 oz (73.5 kg)     Height 5\' 3"  (1.6 m)     Head Circumference      Peak Flow      Pain Score 0     Pain Loc      Pain Edu?      Excl. in GC?     Constitutional: Alert and oriented. Well appearing and in no acute distress. Eyes: Conjunctivae are normal. PERRL. EOMI. Head: Atraumatic. Nose: No congestion/rhinnorhea. Mouth/Throat: Mucous membranes are moist.  Oropharynx non-erythematous. Cardiovascular: Normal rate, regular rhythm. Grossly normal heart sounds.  Good peripheral circulation. Respiratory: Normal respiratory effort.  No retractions. Lungs CTAB. Neurologic:  Normal speech and language. No gross focal neurologic deficits are appreciated. No gait instability. Skin:  Skin is warm, dry and intact. No rash noted. Psychiatric: Mood and affect are normal. Speech and behavior are normal.  ____________________________________________   LABS (all labs ordered are listed, but only abnormal results are displayed)  Labs Reviewed - No data to display ____________________________________________  EKG   ____________________________________________  RADIOLOGY I, , personally viewed and evaluated these images (plain radiographs) as part of my medical decision making, as well as reviewing the written report by the radiologist.  ED MD interpretation:    Official radiology report(s): No results found.  ____________________________________________   PROCEDURES  Procedure(s) performed (including Critical Care):  Procedures   ____________________________________________   INITIAL IMPRESSION / ASSESSMENT AND PLAN / ED COURSE  As part of my medical decision making, I reviewed the following data within the electronic MEDICAL RECORD NUMBER       Patient reports for second shot of rabies series secondary to possible exposure to bat.  Patient states no adverse reaction to initial dosage.  Patient given discharge care instruction advised to return back on day 7  for next shot.      ____________________________________________   FINAL CLINICAL IMPRESSION(S) / ED DIAGNOSES  Final diagnoses:  Contact with and suspected exposure to rabies     ED Discharge Orders    None      *Please note:  Troi A Roudebush was evaluated in Emergency Department on 05/22/2020 for the symptoms described in the history of present illness. She was evaluated in the context of the global COVID-19 pandemic, which necessitated consideration that the patient might be at risk for infection with the SARS-CoV-2 virus that causes COVID-19. Institutional protocols and algorithms that pertain to the evaluation of patients at risk for COVID-19 are in a state of rapid change based on information released by regulatory bodies including the CDC and federal and state organizations. These policies and algorithms were followed during the patient's care in the ED.  Some ED evaluations and interventions may be delayed as a result of limited staffing during and the pandemic.*  Note:  This document was prepared using Dragon voice recognition software and may include unintentional dictation errors.    Joni Reining, PA-C 05/22/20 0848    Gilles Chiquito, MD 05/22/20 1256

## 2020-05-22 NOTE — ED Triage Notes (Signed)
Pt here for 2nd rabies injection 

## 2020-05-26 ENCOUNTER — Other Ambulatory Visit: Payer: Self-pay

## 2020-05-26 ENCOUNTER — Emergency Department
Admission: EM | Admit: 2020-05-26 | Discharge: 2020-05-26 | Disposition: A | Discharge status: Home / Self Care | Payer: BC Managed Care – PPO | Attending: Emergency Medicine | Admitting: Emergency Medicine

## 2020-05-26 DIAGNOSIS — Z23 Encounter for immunization: Secondary | ICD-10-CM | POA: Insufficient documentation

## 2020-05-26 DIAGNOSIS — Z203 Contact with and (suspected) exposure to rabies: Secondary | ICD-10-CM | POA: Insufficient documentation

## 2020-05-26 MED ORDER — RABIES VACCINE, PCEC IM SUSR
1.0000 mL | Freq: Once | INTRAMUSCULAR | Status: AC
  Administered 2020-05-26: 1 mL via INTRAMUSCULAR
  Filled 2020-05-26: qty 1

## 2020-05-26 NOTE — ED Triage Notes (Signed)
Pt here for 3rd rabies shot  

## 2020-05-26 NOTE — ED Provider Notes (Signed)
[facility] Emergency Department Provider Note   ____________________________________________   Event Date/Time   First MD Initiated Contact with Patient 05/26/20 1002     (approximate)  I have reviewed the triage vital signs and the nursing notes.   HISTORY  Chief Complaint Rabies Injection    HPI Heather Madden is a 65 y.o. female patient presents for third rabies injection secondary to exposure to bat.  Patient no adverse reactions to previous injections.      Past Medical History:  Diagnosis Date  . Complication of anesthesia    SEE ALLERGIES  . Glaucoma   . MVP (mitral valve prolapse)   . PONV (postoperative nausea and vomiting)     There are no problems to display for this patient.   Past Surgical History:  Procedure Laterality Date  . COLONOSCOPY    . COLPOSCOPY    . EYE SURGERY     DETACHED RETINA  . LAPAROSCOPIC BILATERAL SALPINGO OOPHERECTOMY Bilateral 04/02/2016   Procedure: LAPAROSCOPIC BILATERAL SALPINGO OOPHORECTOMY;  Surgeon: Elenora Fender Ward, MD;  Location: [facility] ORS;  Service: Gynecology;  Laterality: Bilateral;  . TONSILLECTOMY      Prior to Admission medications   Medication Sig Start Date End Date Taking? Authorizing Provider  acetaminophen (TYLENOL) 500 MG tablet Take 2 tablets (1,000 mg total) by mouth every 6 (six) hours. 04/02/16   Ward, Elenora Fender, MD  calcium-vitamin D (OSCAL WITH D) 500-200 MG-UNIT tablet Take 1 tablet by mouth daily.    [provider]  Cholecalciferol (VITAMIN D3) 5000 units TABS Take 1 tablet by mouth daily.    [provider]  Coenzyme Q10 (CO Q 10 PO) Take 100 mg by mouth daily.    [provider]  DOCOSAHEXAENOIC ACID-EPA PO Take 30 mLs by mouth daily.    [provider]  dorzolamide (TRUSOPT) 2 % ophthalmic solution Place 1 drop into both eyes 3 (three) times daily.    [provider]  ibuprofen (ADVIL,MOTRIN) 600 MG tablet Take 1 tablet (600 mg  total) by mouth every 6 (six) hours. 04/02/16   Ward, Elenora Fender, MD  latanoprost (XALATAN) 0.005 % ophthalmic solution Place 1 drop into both eyes at bedtime.    [provider]  oxyCODONE (OXY IR/ROXICODONE) 5 MG immediate release tablet Take 1 tablet (5 mg total) by mouth every 4 (four) hours as needed for severe pain. 04/02/16   Ward, Elenora Fender, MD  Psyllium Husk POWD by Does not apply route.    [provider]  Red Yeast Rice Extract (RED YEAST RICE PO) Take 2,400 mg by mouth daily.    [provider]  UNABLE TO FIND Med Name: [facility] RED POWDER    [provider]    Allergies Combigan [brimonidine tartrate-timolol], Fentanyl, Midazolam, and Timolol  Family History  Problem Relation Age of Onset  . Breast cancer Maternal Aunt 50  . Ovarian cancer Maternal Aunt 70  . Breast cancer Cousin        maternal  . Breast cancer Maternal Aunt     Social History Social History   Tobacco Use  . Smoking status: Never Smoker  . Smokeless tobacco: Never Used  Substance Use Topics  . Alcohol use: Yes    Alcohol/week: 2.0 standard drinks    Types: 2 Glasses of wine per week  . Drug use: No    Review of Systems Constitutional: No fever/chills Eyes: No visual changes. ENT: No sore throat. Cardiovascular: Denies chest pain.  Respiratory: Denies shortness of breath. Gastrointestinal: No abdominal pain.  No nausea, no vomiting.  No diarrhea.  No constipation. Genitourinary: Negative for dysuria. Musculoskeletal: Negative for back pain. Skin: Negative for rash. Neurological: Negative for headaches, focal weakness or numbness. Allergic/Immunilogical: Combigan, fentanyl,Midazolam, and Timolol. ____________________________________________   PHYSICAL EXAM:  VITAL SIGNS: ED Triage Vitals  Enc Vitals Group     BP 05/26/20 0954 129/89     Pulse Rate 05/26/20 0954 90     Resp 05/26/20 0954 18     Temp 05/26/20 0954 98.1 F (36.7 C)     Temp Source  05/26/20 0954 Oral     SpO2 05/26/20 0954 96 %     Weight 05/26/20 0951 162 lb 0.6 oz (73.5 kg)     Height 05/26/20 0951 5\' 3"  (1.6 m)     Head Circumference --      Peak Flow --      Pain Score 05/26/20 0951 0     Pain Loc --      Pain Edu? --      Excl. in GC? --     Constitutional: Alert and oriented. Well appearing and in no acute distress. Cardiovascular: Normal rate, regular rhythm. Grossly normal heart sounds.  Good peripheral circulation. Respiratory: Normal respiratory effort.  No retractions. Lungs CTAB. Musculoskeletal: No lower extremity tenderness nor edema.  No joint effusions. Neurologic:  Normal speech and language. No gross focal neurologic deficits are appreciated. No gait instability. Skin:  Skin is warm, dry and intact. No rash noted. Psychiatric: Mood and affect are normal. Speech and behavior are normal.  ____________________________________________   LABS (all labs ordered are listed, but only abnormal results are displayed)  Labs Reviewed - No data to display ____________________________________________  EKG   ____________________________________________  RADIOLOGY I, 07/26/20, personally viewed and evaluated these images (plain radiographs) as part of my medical decision making, as well as reviewing the written report by the radiologist.  ED MD interpretation:    Official radiology report(s): No results found.  ____________________________________________   PROCEDURES  Procedure(s) performed (including Critical Care):  Procedures   ____________________________________________   INITIAL IMPRESSION / ASSESSMENT AND PLAN / ED COURSE  As part of my medical decision making, I reviewed the following data within the electronic MEDICAL RECORD NUMBER         Patient presents for third rabies shot as prophylactic secondary to bat exposure.  No adverse reaction to previous injection.  Patient given discharge care instruction advised return  back in 1 week to complete series.      ____________________________________________   FINAL CLINICAL IMPRESSION(S) / ED DIAGNOSES  Final diagnoses:  Contact with and (suspected) exposure to rabies     ED Discharge Orders    None      *Please note:  Heather Madden was evaluated in Emergency Department on 05/26/2020 for the symptoms described in the history of present illness. She was evaluated in the context of the global COVID-19 pandemic, which necessitated consideration that the patient might be at risk for infection with the SARS-CoV-2 virus that causes COVID-19. Institutional protocols and algorithms that pertain to the evaluation of patients at risk for COVID-19 are in a state of rapid change based on information released by regulatory bodies including the CDC and federal and state organizations. These policies and algorithms were followed during the patient's care in the ED.  Some ED evaluations and interventions may be delayed as a result of limited staffing during and the  pandemic.*   Note:  This document was prepared using Dragon voice recognition software and may include unintentional dictation errors.    Joni Reining, PA-C 05/26/20 1006    Concha Se, MD 05/26/20 1022

## 2020-06-03 ENCOUNTER — Emergency Department
Admission: EM | Admit: 2020-06-03 | Discharge: 2020-06-03 | Disposition: A | Discharge status: Home / Self Care | Payer: BC Managed Care – PPO | Attending: Student in an Organized Health Care Education/Training Program | Admitting: Student in an Organized Health Care Education/Training Program

## 2020-06-03 ENCOUNTER — Encounter: Payer: Self-pay | Admitting: Intensive Care

## 2020-06-03 ENCOUNTER — Other Ambulatory Visit: Payer: Self-pay

## 2020-06-03 DIAGNOSIS — Z23 Encounter for immunization: Secondary | ICD-10-CM | POA: Diagnosis not present

## 2020-06-03 DIAGNOSIS — Z203 Contact with and (suspected) exposure to rabies: Secondary | ICD-10-CM | POA: Insufficient documentation

## 2020-06-03 HISTORY — DX: Pure hypercholesterolemia, unspecified: E78.00

## 2020-06-03 MED ORDER — RABIES VACCINE, PCEC IM SUSR
1.0000 mL | Freq: Once | INTRAMUSCULAR | Status: AC
  Administered 2020-06-03: 1 mL via INTRAMUSCULAR
  Filled 2020-06-03: qty 1

## 2020-06-03 NOTE — ED Notes (Signed)
See triage note  Presents for rabies vaccine  Denies nay other complaints

## 2020-06-03 NOTE — ED Provider Notes (Signed)
[facility] Emergency Department Provider Note  ____________________________________________  Time seen: Approximately 3:23 PM  I have reviewed the triage vital signs and the nursing notes.   HISTORY  Chief Complaint Rabies Injection    HPI Heather Madden is a 65 y.o. female who presents emergency department for fourth and final rabies shot.  Patient denies any complications with any of the previous injections.  She denies any current symptoms or complaints.   See original documentation from 04/28 for original details.  Again patient has no complaints or concerns, and is here for her fourth and final rabies shot.        Past Medical History:  Diagnosis Date  . Complication of anesthesia    SEE ALLERGIES  . Glaucoma   . High cholesterol   . MVP (mitral valve prolapse)   . PONV (postoperative nausea and vomiting)     There are no problems to display for this patient.   Past Surgical History:  Procedure Laterality Date  . COLONOSCOPY    . COLPOSCOPY    . EYE SURGERY     DETACHED RETINA  . LAPAROSCOPIC BILATERAL SALPINGO OOPHERECTOMY Bilateral 04/02/2016   Procedure: LAPAROSCOPIC BILATERAL SALPINGO OOPHORECTOMY;  Surgeon: Elenora Fender Ward, MD;  Location: [facility] ORS;  Service: Gynecology;  Laterality: Bilateral;  . TONSILLECTOMY      Prior to Admission medications   Medication Sig Start Date End Date Taking? Authorizing Provider  acetaminophen (TYLENOL) 500 MG tablet Take 2 tablets (1,000 mg total) by mouth every 6 (six) hours. 04/02/16   Ward, Elenora Fender, MD  calcium-vitamin D (OSCAL WITH D) 500-200 MG-UNIT tablet Take 1 tablet by mouth daily.    [provider]  Cholecalciferol (VITAMIN D3) 5000 units TABS Take 1 tablet by mouth daily.    [provider]  Coenzyme Q10 (CO Q 10 PO) Take 100 mg by mouth daily.    [provider]  DOCOSAHEXAENOIC ACID-EPA PO Take 30 mLs by mouth daily.    [provider]  dorzolamide  (TRUSOPT) 2 % ophthalmic solution Place 1 drop into both eyes 3 (three) times daily.    [provider]  ibuprofen (ADVIL,MOTRIN) 600 MG tablet Take 1 tablet (600 mg total) by mouth every 6 (six) hours. 04/02/16   Ward, Elenora Fender, MD  latanoprost (XALATAN) 0.005 % ophthalmic solution Place 1 drop into both eyes at bedtime.    [provider]  oxyCODONE (OXY IR/ROXICODONE) 5 MG immediate release tablet Take 1 tablet (5 mg total) by mouth every 4 (four) hours as needed for severe pain. 04/02/16   Ward, Elenora Fender, MD  Psyllium Husk POWD by Does not apply route.    [provider]  Red Yeast Rice Extract (RED YEAST RICE PO) Take 2,400 mg by mouth daily.    [provider]  UNABLE TO FIND Med Name: [facility] RED POWDER    [provider]    Allergies Combigan [brimonidine tartrate-timolol], Fentanyl, Midazolam, and Timolol  Family History  Problem Relation Age of Onset  . Breast cancer Maternal Aunt 50  . Ovarian cancer Maternal Aunt 70  . Breast cancer Cousin        maternal  . Breast cancer Maternal Aunt     Social History Social History   Tobacco Use  . Smoking status: Never Smoker  . Smokeless tobacco: Never Used  Substance Use Topics  . Alcohol use: Yes    Alcohol/week: 2.0 standard drinks    Types: 2 Glasses  of wine per week  . Drug use: No     Review of Systems  Constitutional: No fever/chills Eyes: No visual changes. No discharge ENT: No upper respiratory complaints. Cardiovascular: no chest pain. Respiratory: no cough. No SOB. Gastrointestinal: No abdominal pain.  No nausea, no vomiting.  No diarrhea.  No constipation. Musculoskeletal: Negative for musculoskeletal pain. Skin: Negative for rash, abrasions, lacerations, ecchymosis. Neurological: Negative for headaches, focal weakness or numbness.  10 System ROS otherwise negative.  ____________________________________________   PHYSICAL EXAM:  VITAL SIGNS: ED Triage  Vitals [06/03/20 1456]  Enc Vitals Group     BP 120/74     Pulse Rate 77     Resp 16     Temp 98 F (36.7 C)     Temp Source Oral     SpO2 97 %     Weight 160 lb (72.6 kg)     Height 5' 3.5" (1.613 m)     Head Circumference      Peak Flow      Pain Score 0     Pain Loc      Pain Edu?      Excl. in GC?      Constitutional: Alert and oriented. Well appearing and in no acute distress. Eyes: Conjunctivae are normal. PERRL. EOMI. Head: Atraumatic. ENT:      Ears:       Nose: No congestion/rhinnorhea.      Mouth/Throat: Mucous membranes are moist.  Neck: No stridor.  Neck is supple full range of motion  Cardiovascular: Normal rate, regular rhythm. Normal S1 and S2.  Good peripheral circulation. Respiratory: Normal respiratory effort without tachypnea or retractions. Lungs CTAB. Good air entry to the bases with no decreased or absent breath sounds. Musculoskeletal: Full range of motion to all extremities. No gross deformities appreciated. Neurologic:  Normal speech and language. No gross focal neurologic deficits are appreciated.  Skin:  Skin is warm, dry and intact. No rash noted. Psychiatric: Mood and affect are normal. Speech and behavior are normal. Patient exhibits appropriate insight and judgement.   ____________________________________________   LABS (all labs ordered are listed, but only abnormal results are displayed)  Labs Reviewed - No data to display ____________________________________________  EKG   ____________________________________________  RADIOLOGY   No results found.  ____________________________________________    PROCEDURES  Procedure(s) performed:    Procedures    Medications  rabies vaccine (RABAVERT) injection 1 mL (1 mL Intramuscular Given 06/03/20 1512)     ____________________________________________   INITIAL IMPRESSION / ASSESSMENT AND PLAN / ED COURSE  Pertinent labs & imaging results that were available during my  care of the patient were reviewed by me and considered in my medical decision making (see chart for details).  Review of the [place] CSRS was performed in accordance of the [facility] prior to dispensing any controlled drugs.           Patient's diagnosis is consistent with rabies vaccination.  Patient presented to the emergency department for her fourth and final rabies vaccination.  Patient states that she has had no complications with any of the injections, is here for the fourth and last shot.  Patient has vaccine administered, will follow-up with primary care as needed, no additional work-up at this time.  Patient does not need to return for any further vaccinations in regards to this current exposure to a bat.. Patient is given ED precautions to return to the ED for any worsening or new symptoms.  ____________________________________________  FINAL CLINICAL IMPRESSION(S) / ED DIAGNOSES  Final diagnoses:  Encounter for repeat administration of rabies vaccination      NEW MEDICATIONS STARTED DURING THIS VISIT:  ED Discharge Orders    None          This chart was dictated using voice recognition software/Dragon. Despite best efforts to proofread, errors can occur which can change the meaning. Any change was purely unintentional.    Racheal Patches, PA-C 06/03/20 1529    Sharman Cheek, MD 06/03/20 2344

## 2020-06-03 NOTE — ED Triage Notes (Signed)
Patient here for last rabies shot

## 2020-08-16 ENCOUNTER — Other Ambulatory Visit: Payer: Self-pay | Admitting: Internal Medicine

## 2020-08-16 DIAGNOSIS — Z1231 Encounter for screening mammogram for malignant neoplasm of breast: Secondary | ICD-10-CM

## 2020-09-19 ENCOUNTER — Ambulatory Visit
Admission: RE | Admit: 2020-09-19 | Discharge: 2020-09-19 | Disposition: A | Discharge status: Home / Self Care | Payer: BC Managed Care – PPO | Source: Ambulatory Visit | Attending: Internal Medicine | Admitting: Internal Medicine

## 2020-09-19 ENCOUNTER — Other Ambulatory Visit: Payer: Self-pay

## 2020-09-19 DIAGNOSIS — Z1231 Encounter for screening mammogram for malignant neoplasm of breast: Secondary | ICD-10-CM | POA: Insufficient documentation

## 2021-09-06 ENCOUNTER — Other Ambulatory Visit: Payer: Self-pay | Admitting: Obstetrics and Gynecology

## 2021-09-06 ENCOUNTER — Other Ambulatory Visit: Payer: Self-pay | Admitting: Internal Medicine

## 2021-09-06 DIAGNOSIS — Z1231 Encounter for screening mammogram for malignant neoplasm of breast: Secondary | ICD-10-CM

## 2021-09-26 ENCOUNTER — Ambulatory Visit
Admission: RE | Admit: 2021-09-26 | Discharge: 2021-09-26 | Disposition: A | Discharge status: Home / Self Care | Payer: BC Managed Care – PPO | Source: Ambulatory Visit | Attending: Obstetrics and Gynecology | Admitting: Obstetrics and Gynecology

## 2021-09-26 DIAGNOSIS — Z1231 Encounter for screening mammogram for malignant neoplasm of breast: Secondary | ICD-10-CM | POA: Diagnosis not present

## 2022-02-02 IMAGING — MG DIGITAL SCREENING BILAT W/ TOMO W/ CAD
6 of 12 series · 6 of 36 positions shown · non-contrast
Comparison: Previous exam(s).

CLINICAL DATA: Screening.

EXAM:
DIGITAL SCREENING BILATERAL MAMMOGRAM WITH TOMO AND CAD

[R CC synth-2D (1 of 3)]
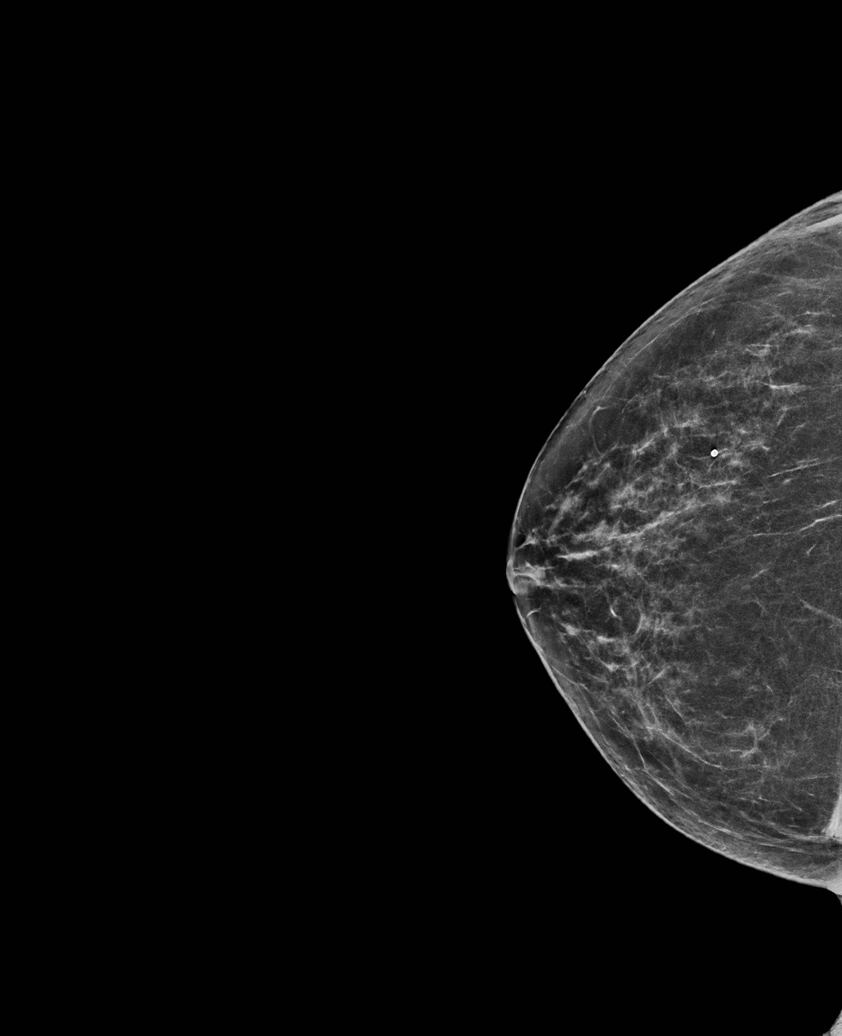

[R CC synth-2D (2 of 3)]
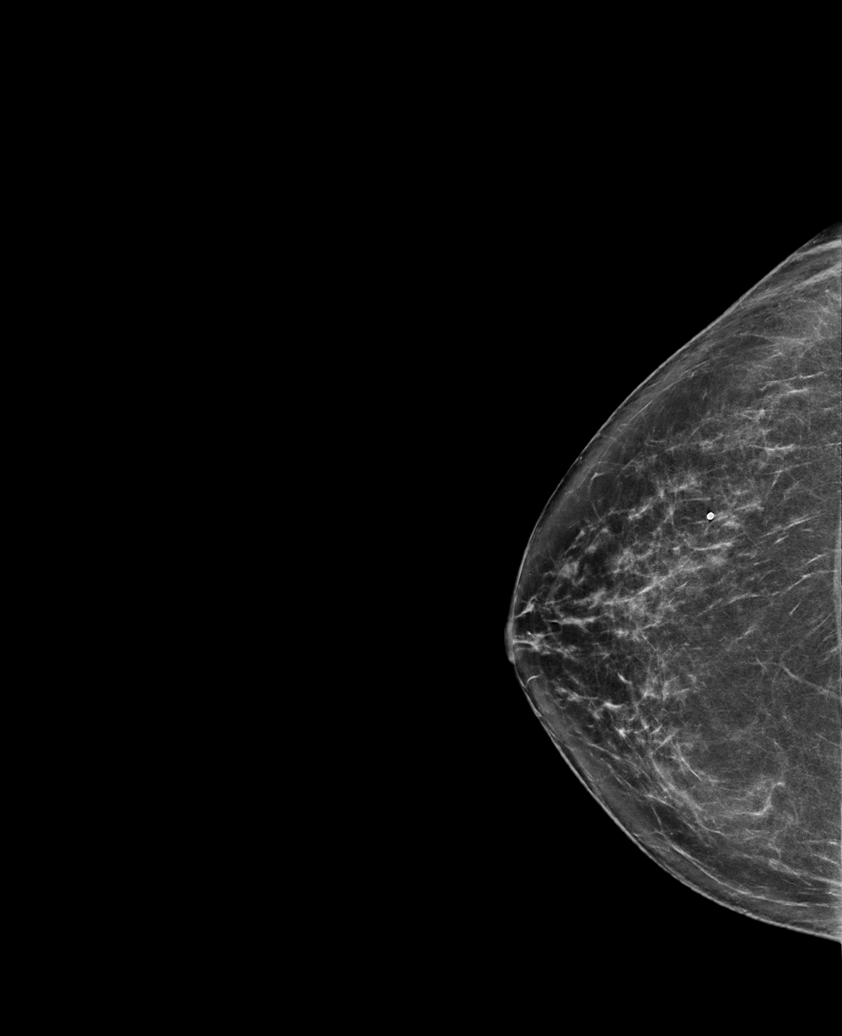

[R CC synth-2D (3 of 3)]
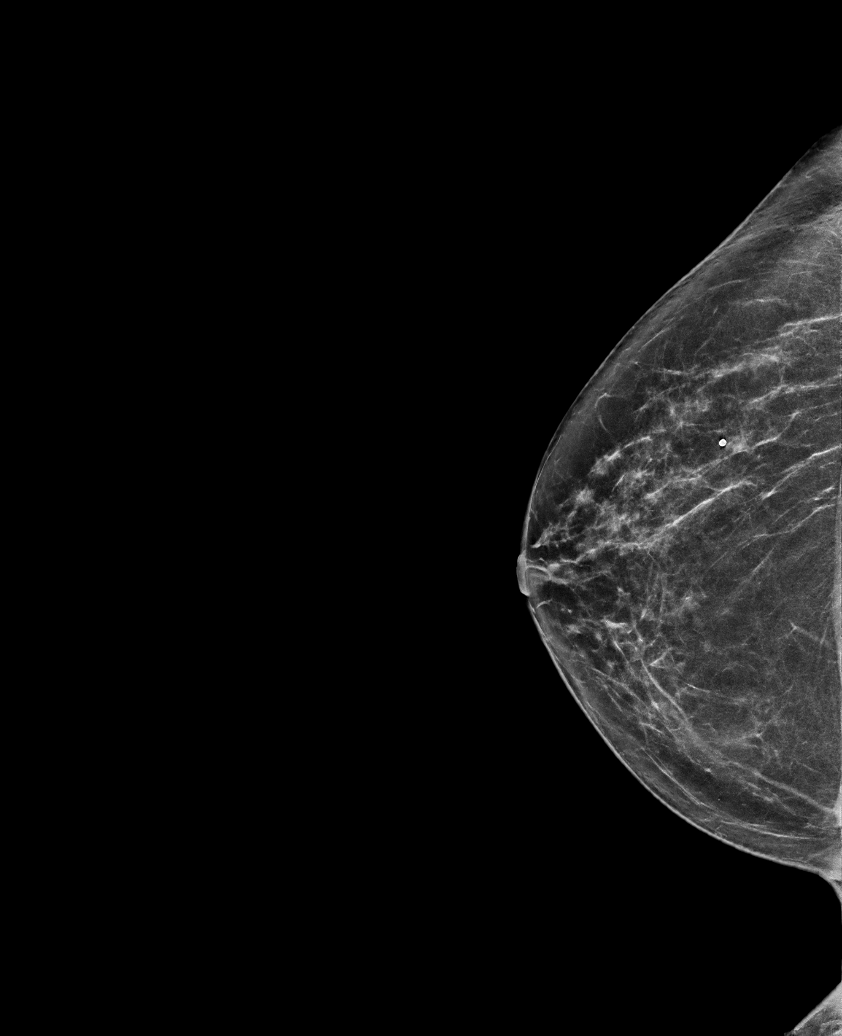

[R MLO synth-2D]
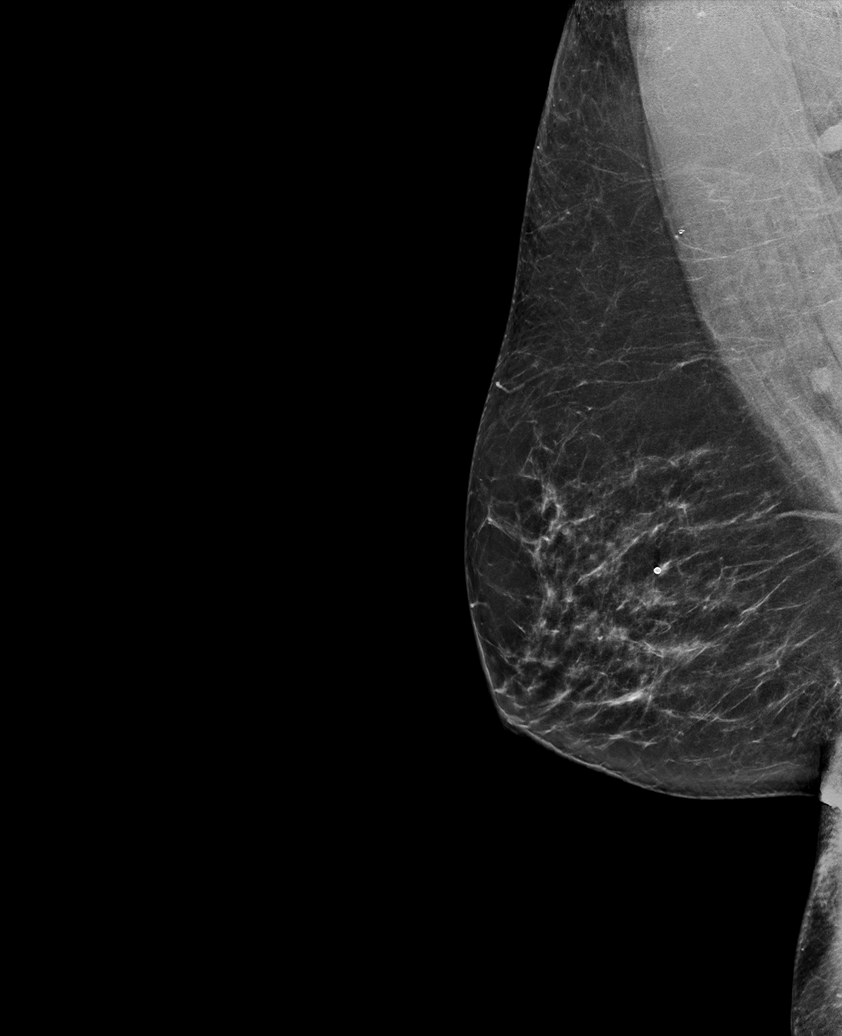

[L MLO synth-2D]
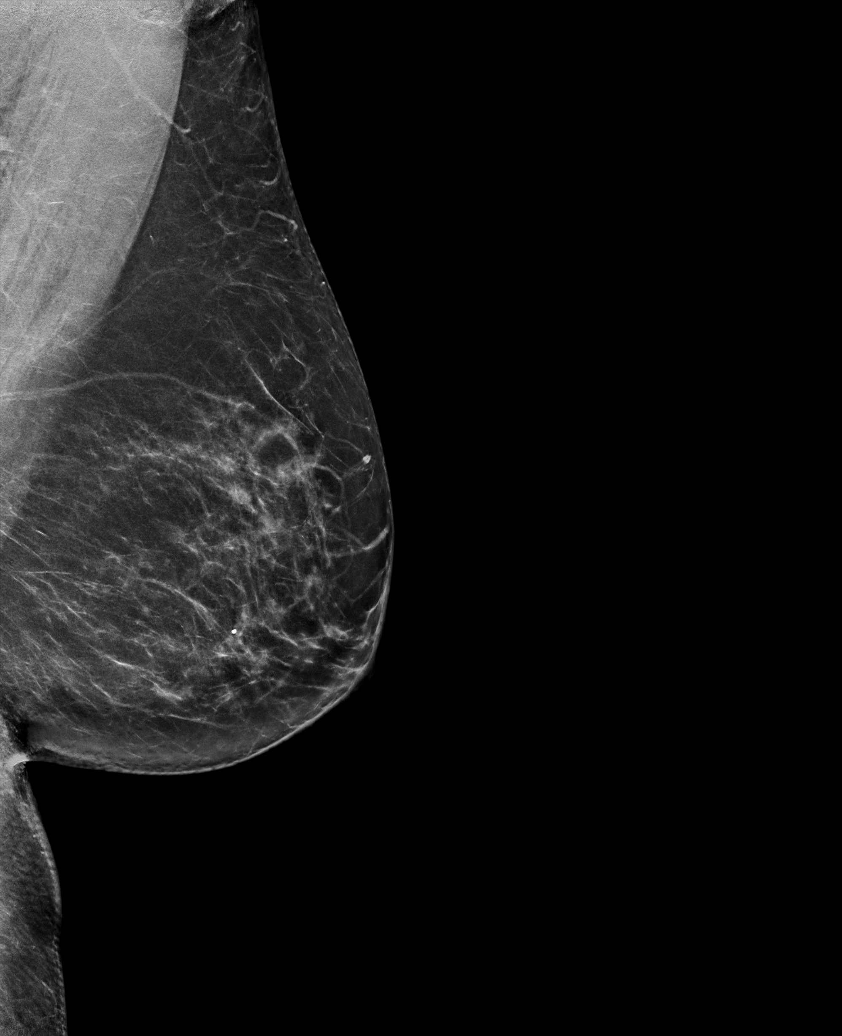

[L CC synth-2D]
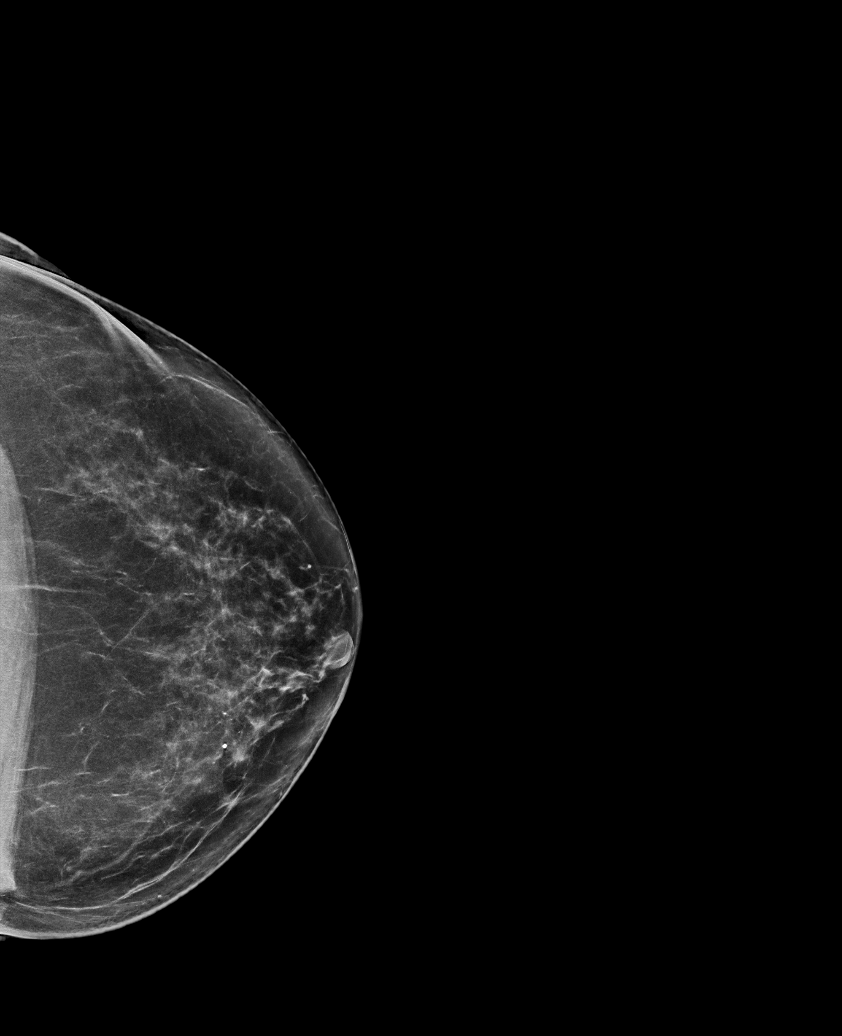

[6 of 36 positions shown; findings below may reference images not displayed]

ACR Breast Density Category b: There are scattered areas of
fibroglandular density.
FINDINGS: There are no findings suspicious for malignancy. Images were
processed with CAD.
IMPRESSION: No mammographic evidence of malignancy. A result letter of this
screening mammogram will be mailed directly to the patient.

RECOMMENDATION:
Screening mammogram in one year. (Code:CN-[id number])

BI-RADS CATEGORY  1: Negative.

## 2022-04-23 IMAGING — US US ABDOMEN LIMITED
1 series · 14 of 25 positions shown · non-contrast
Comparison: 01/03/2005

CLINICAL DATA: Elevated LFTs

EXAM:
ULTRASOUND ABDOMEN LIMITED RIGHT UPPER QUADRANT

[Series 1: us abdomen limited ruq (liver/gb) · 14 of 39 slices shown]
[im 1/39]
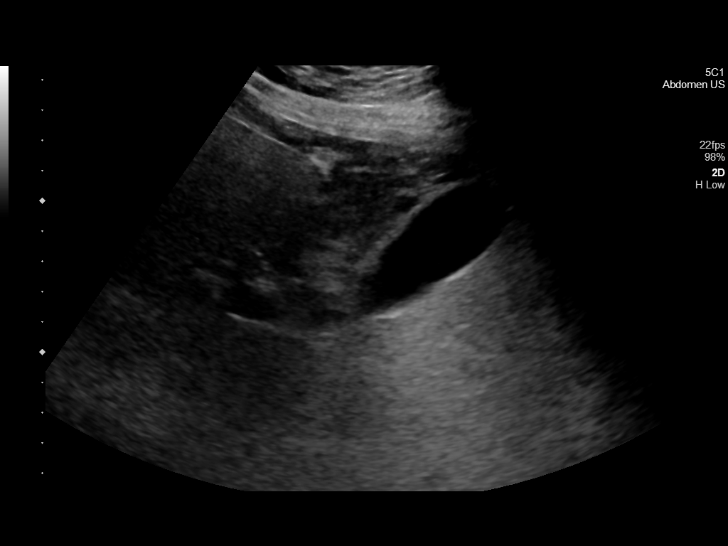
[im 4/39]
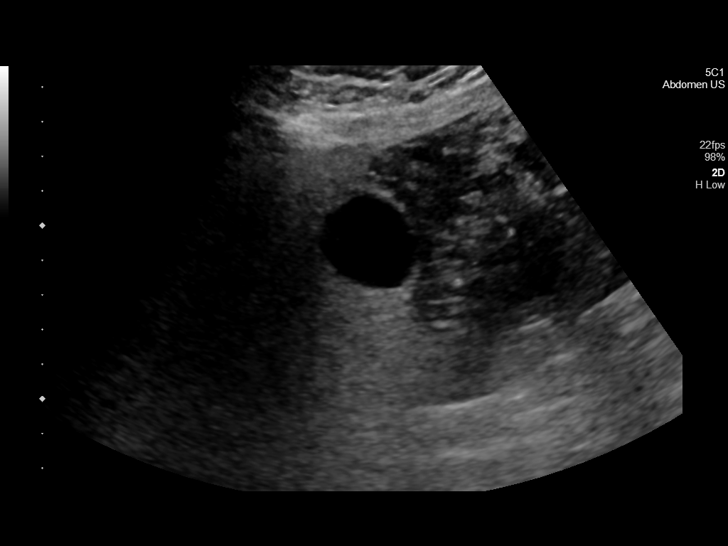
[im 7/39]
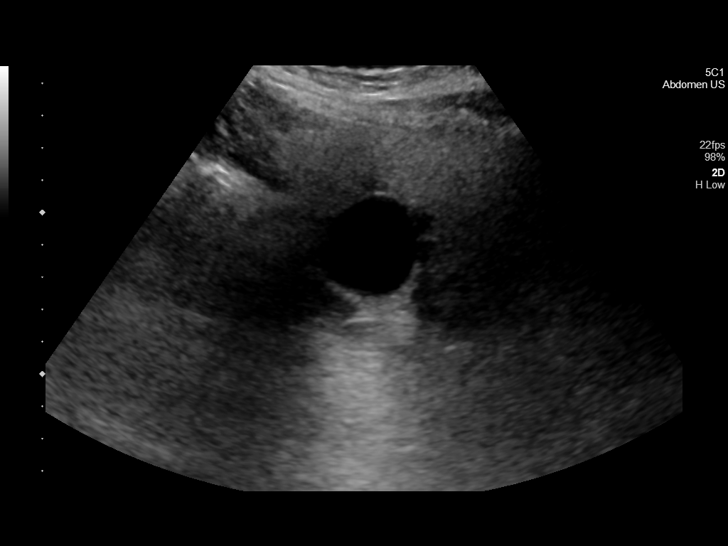
[im 10/39]
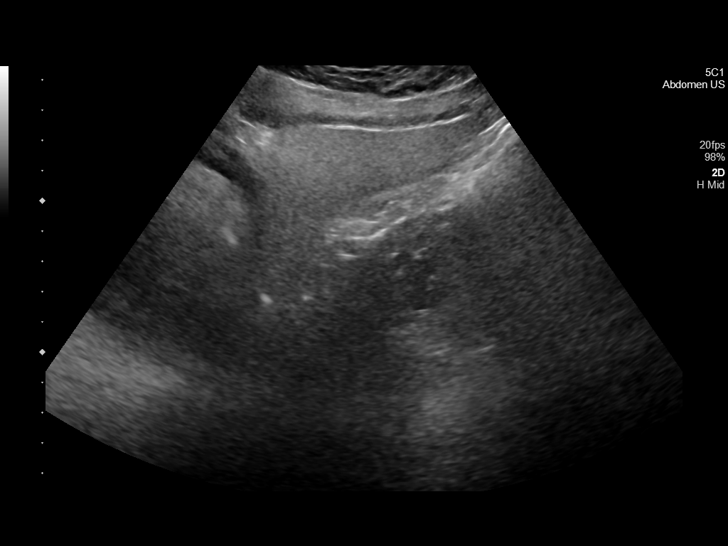
[im 13/39]
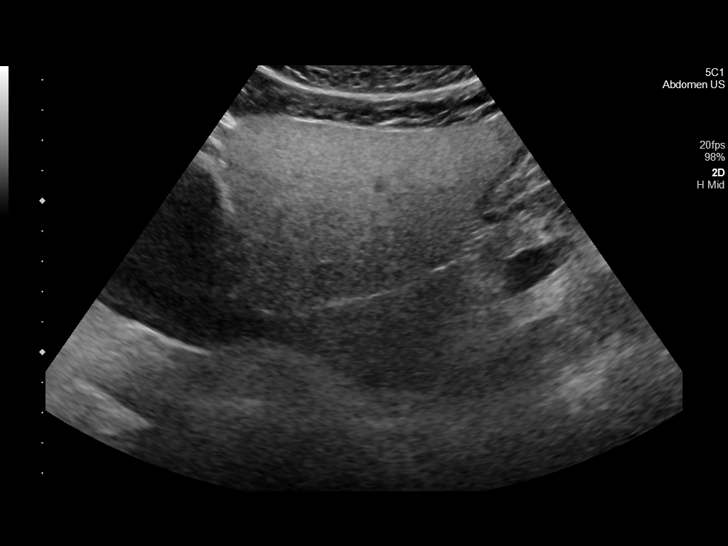
[im 15/39]
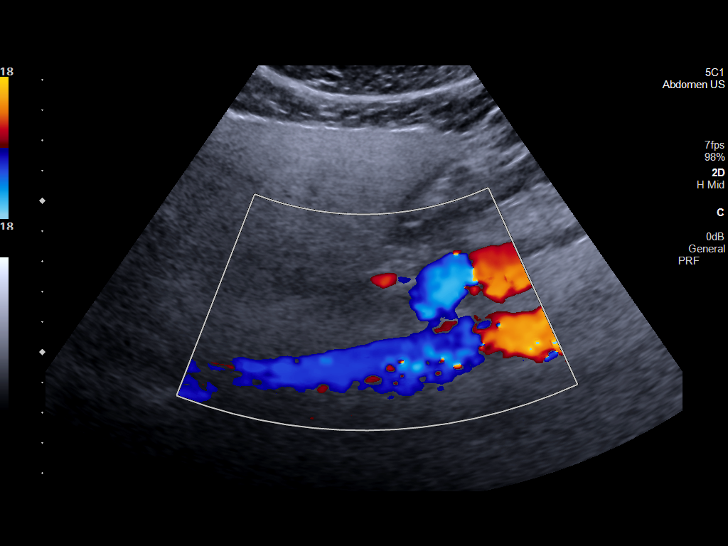
[im 18/39]
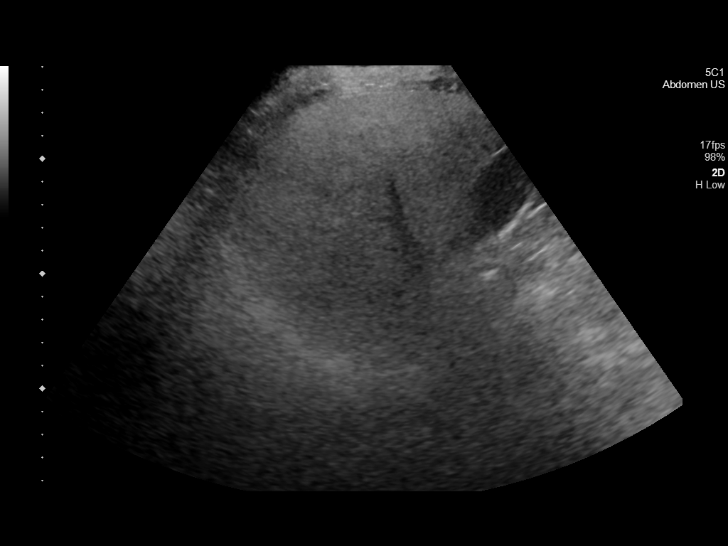
[im 21/39]
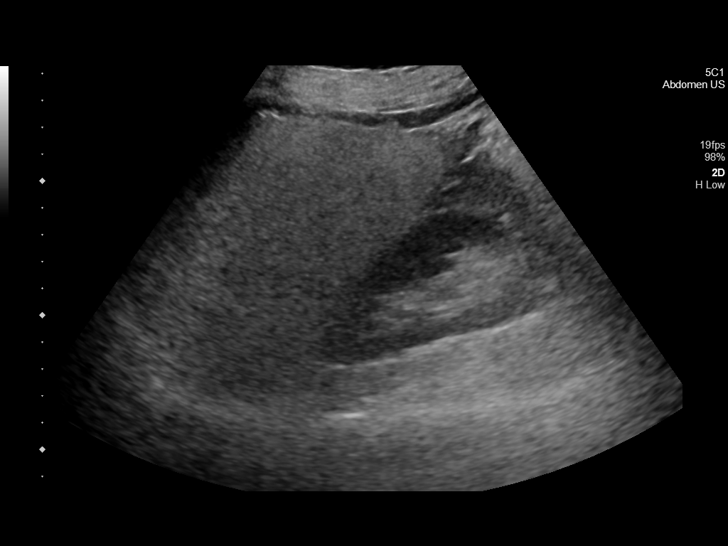
[im 24/39]
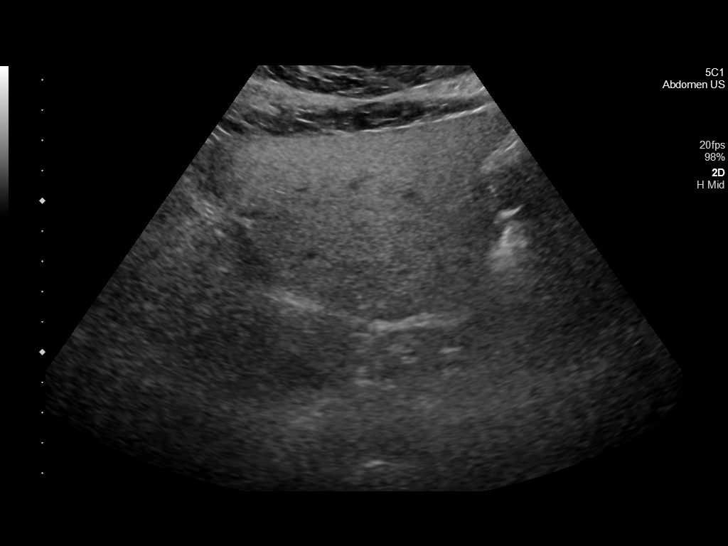
[im 26/39]
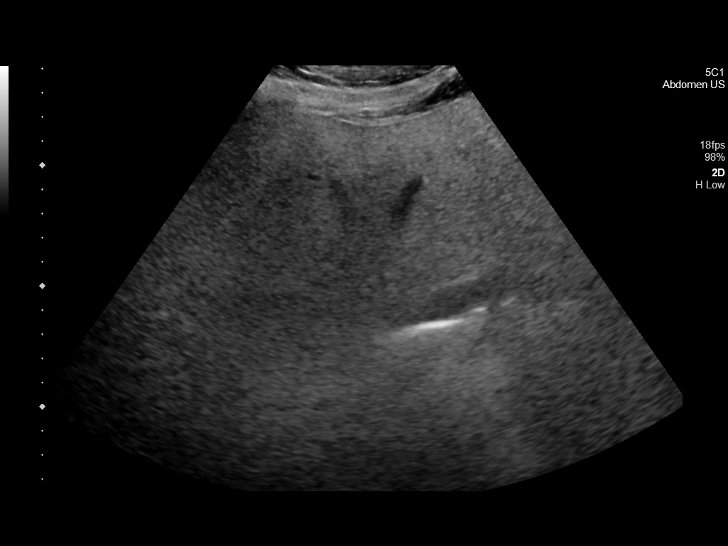
[im 29/39]
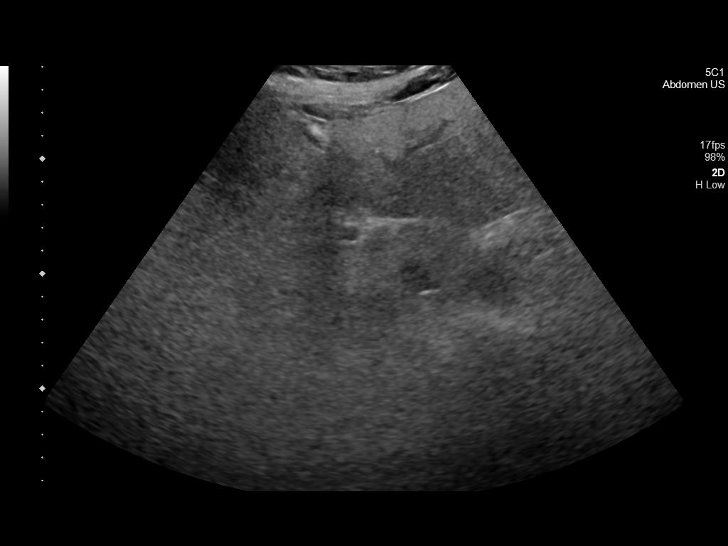
[im 32/39]
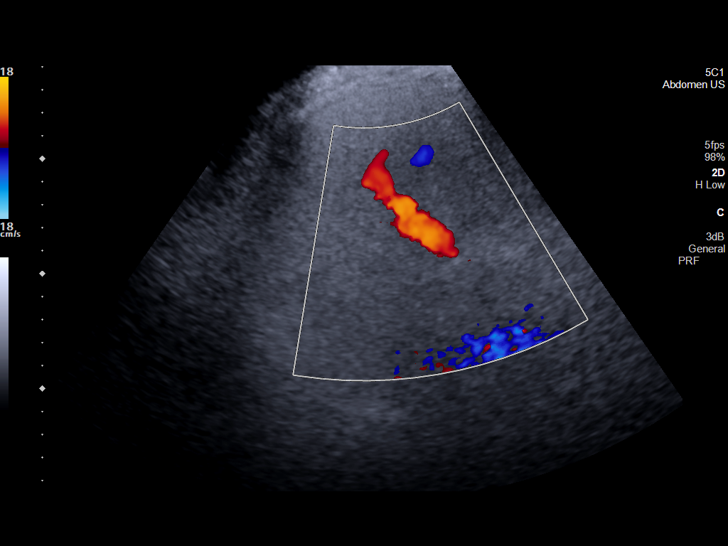
[im 35/39]
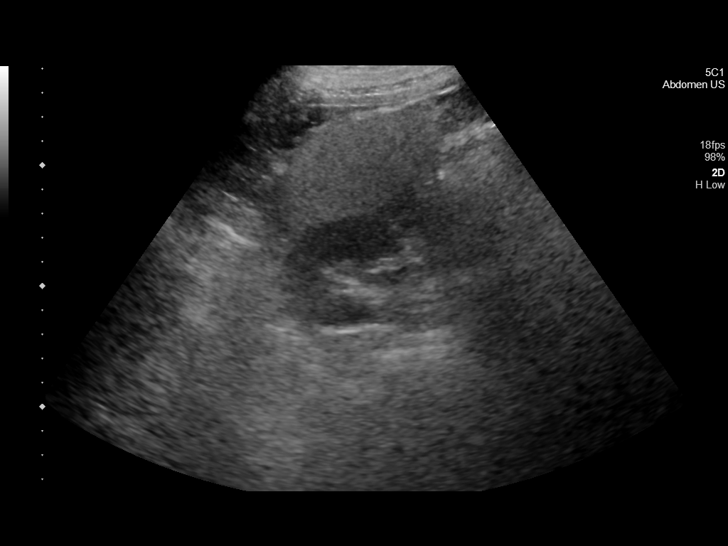
[im 39/39]
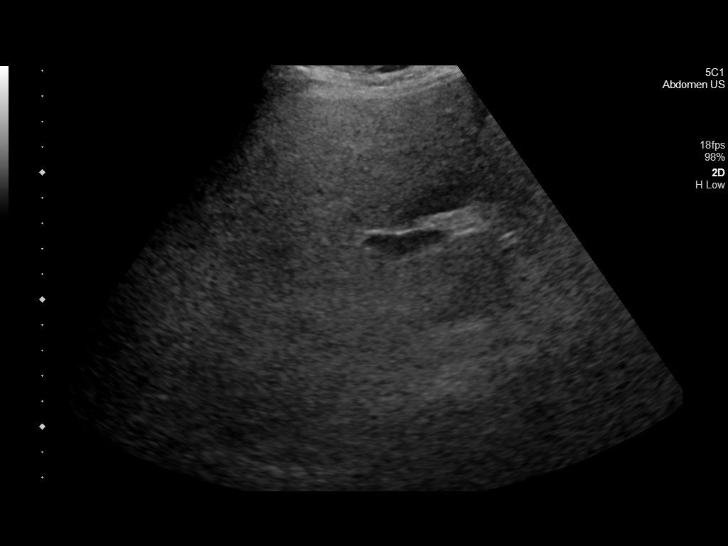

[14 of 25 positions shown; findings below may reference images not displayed]

FINDINGS: Gallbladder:

Normally distended without stones or wall thickening. No
pericholecystic fluid or sonographic Murphy sign.

Common bile duct:

Diameter: 2 mm, normal

Liver:

Echogenic parenchyma, likely fatty infiltration though this can be
seen with cirrhosis and certain infiltrative disorders. Suboptimal
assessment of intrahepatic detail due to sound attenuation. No gross
mass or nodularity identified on limited assessment. Portal vein is
patent on color Doppler imaging with normal direction of blood flow
towards the liver.

Other: No RIGHT upper quadrant free fluid.
IMPRESSION: Probable fatty infiltration of liver as above.

Suboptimal assessment of intrahepatic detail due to sound
attenuation; if better intra hepatic visualization is required,
consider MR or CT assessment.

Remainder of exam unremarkable.

## 2022-09-12 ENCOUNTER — Other Ambulatory Visit: Payer: Self-pay | Admitting: Internal Medicine

## 2022-09-12 DIAGNOSIS — Z1231 Encounter for screening mammogram for malignant neoplasm of breast: Secondary | ICD-10-CM

## 2022-09-21 ENCOUNTER — Other Ambulatory Visit: Payer: Self-pay | Admitting: Obstetrics and Gynecology

## 2022-09-21 DIAGNOSIS — Z1231 Encounter for screening mammogram for malignant neoplasm of breast: Secondary | ICD-10-CM

## 2022-10-01 ENCOUNTER — Ambulatory Visit
Admission: RE | Admit: 2022-10-01 | Discharge: 2022-10-01 | Disposition: A | Discharge status: Home / Self Care | Payer: BC Managed Care – PPO | Source: Ambulatory Visit | Attending: Internal Medicine | Admitting: Internal Medicine

## 2022-10-01 DIAGNOSIS — Z1231 Encounter for screening mammogram for malignant neoplasm of breast: Secondary | ICD-10-CM | POA: Diagnosis present

## 2023-08-27 ENCOUNTER — Other Ambulatory Visit: Payer: Self-pay | Admitting: Internal Medicine

## 2023-08-27 DIAGNOSIS — Z1231 Encounter for screening mammogram for malignant neoplasm of breast: Secondary | ICD-10-CM

## 2023-10-02 ENCOUNTER — Ambulatory Visit
Admission: RE | Admit: 2023-10-02 | Discharge: 2023-10-02 | Disposition: A | Discharge status: Home / Self Care | Source: Ambulatory Visit | Attending: Internal Medicine | Admitting: Internal Medicine

## 2023-10-02 DIAGNOSIS — Z1231 Encounter for screening mammogram for malignant neoplasm of breast: Secondary | ICD-10-CM | POA: Diagnosis present
